# Patient Record
Sex: Male | Born: 1985 | Race: White | Hispanic: No | Smoking: Current every day smoker
Health system: Southern US, Community
[De-identification: ages and names within clinical notes are randomized; demographics above are authoritative.]

## PROBLEM LIST (undated history)

## (undated) DIAGNOSIS — A6002 Herpesviral infection of other male genital organs: Secondary | ICD-10-CM

## (undated) DIAGNOSIS — Z8619 Personal history of other infectious and parasitic diseases: Secondary | ICD-10-CM

## (undated) DIAGNOSIS — Z789 Other specified health status: Secondary | ICD-10-CM

## (undated) DIAGNOSIS — F1910 Other psychoactive substance abuse, uncomplicated: Secondary | ICD-10-CM

## (undated) DIAGNOSIS — F17200 Nicotine dependence, unspecified, uncomplicated: Secondary | ICD-10-CM

## (undated) HISTORY — PX: NO PAST SURGERIES: SHX2092

## (undated) HISTORY — DX: Personal history of other infectious and parasitic diseases: Z86.19

---

## 2007-12-02 ENCOUNTER — Emergency Department (HOSPITAL_COMMUNITY): Admission: EM | Admit: 2007-12-02 | Discharge: 2007-12-02 | Payer: Self-pay | Admitting: Emergency Medicine

## 2007-12-22 ENCOUNTER — Emergency Department (HOSPITAL_COMMUNITY): Admission: EM | Admit: 2007-12-22 | Discharge: 2007-12-22 | Payer: Self-pay | Admitting: Emergency Medicine

## 2008-08-11 ENCOUNTER — Emergency Department (HOSPITAL_COMMUNITY): Admission: EM | Admit: 2008-08-11 | Discharge: 2008-08-11 | Payer: Self-pay | Admitting: Emergency Medicine

## 2009-01-28 ENCOUNTER — Emergency Department (HOSPITAL_COMMUNITY): Admission: EM | Admit: 2009-01-28 | Discharge: 2009-01-28 | Payer: Self-pay | Admitting: Emergency Medicine

## 2010-04-30 ENCOUNTER — Emergency Department (HOSPITAL_COMMUNITY)
Admission: EM | Admit: 2010-04-30 | Discharge: 2010-04-30 | Disposition: A | Discharge status: Home / Self Care | Payer: Self-pay | Attending: Emergency Medicine | Admitting: Emergency Medicine

## 2010-04-30 ENCOUNTER — Emergency Department (HOSPITAL_COMMUNITY): Payer: Self-pay

## 2010-04-30 ENCOUNTER — Encounter (HOSPITAL_COMMUNITY): Payer: Self-pay | Admitting: Radiology

## 2010-04-30 DIAGNOSIS — R109 Unspecified abdominal pain: Secondary | ICD-10-CM | POA: Insufficient documentation

## 2010-04-30 DIAGNOSIS — N390 Urinary tract infection, site not specified: Secondary | ICD-10-CM | POA: Insufficient documentation

## 2010-04-30 DIAGNOSIS — R300 Dysuria: Secondary | ICD-10-CM | POA: Insufficient documentation

## 2010-04-30 HISTORY — DX: Herpesviral infection of other male genital organs: A60.02

## 2010-04-30 HISTORY — DX: Nicotine dependence, unspecified, uncomplicated: F17.200

## 2010-04-30 LAB — BASIC METABOLIC PANEL
Calcium: 8.9 mg/dL (ref 8.4–10.5)
Creatinine, Ser: 0.78 mg/dL (ref 0.4–1.5)
GFR calc Af Amer: >60 mL/min (ref >60)
GFR calc non Af Amer: >60 mL/min (ref >60)
Glucose, Bld: 96 mg/dL (ref 70–99)
Potassium: 3.3 mEq/L — ABNORMAL LOW (ref 3.5–5.1)
Sodium: 139 mEq/L (ref 135–145)

## 2010-04-30 LAB — CBC
HCT: 45.4 % (ref 39.0–52.0)
MCHC: 35.7 g/dL (ref 30.0–36.0)
Platelets: 237 10*3/uL (ref 150–400)
RBC: 4.8 MIL/uL (ref 4.22–5.81)
RDW: 12.2 % (ref 11.5–15.5)
WBC: 16.1 10*3/uL — ABNORMAL HIGH (ref 4.0–10.5)

## 2010-04-30 LAB — URINE MICROSCOPIC-ADD ON

## 2010-04-30 LAB — DIFFERENTIAL
Basophils Absolute: 0 10*3/uL (ref 0.0–0.1)
Basophils Relative: 0 % (ref 0–1)
Eosinophils Relative: 1 % (ref 0–5)
Lymphocytes Relative: 14 % (ref 12–46)
Monocytes Absolute: 1.3 10*3/uL — ABNORMAL HIGH (ref 0.1–1.0)

## 2010-04-30 LAB — URINALYSIS, ROUTINE W REFLEX MICROSCOPIC
Bilirubin Urine: NEGATIVE
Glucose, UA: NEGATIVE mg/dL
Ketones, ur: NEGATIVE mg/dL
Nitrite: POSITIVE — AB
Specific Gravity, Urine: 1.012 (ref 1.005–1.030)
pH: 6 (ref 5.0–8.0)

## 2010-05-02 LAB — URINE CULTURE
Colony Count: >=100000
Culture  Setup Time: 201204250939

## 2010-05-30 ENCOUNTER — Emergency Department (HOSPITAL_COMMUNITY): Payer: Medicaid Other

## 2010-05-30 ENCOUNTER — Inpatient Hospital Stay (HOSPITAL_COMMUNITY)
Admission: EM | Admit: 2010-05-30 | Discharge: 2010-06-01 | DRG: 690 | Disposition: A | Discharge status: Home / Self Care | Payer: Medicaid Other | Attending: Emergency Medicine | Admitting: Emergency Medicine

## 2010-05-30 DIAGNOSIS — F1210 Cannabis abuse, uncomplicated: Secondary | ICD-10-CM | POA: Diagnosis present

## 2010-05-30 DIAGNOSIS — F17200 Nicotine dependence, unspecified, uncomplicated: Secondary | ICD-10-CM | POA: Diagnosis present

## 2010-05-30 DIAGNOSIS — N3090 Cystitis, unspecified without hematuria: Principal | ICD-10-CM | POA: Diagnosis present

## 2010-05-30 DIAGNOSIS — E876 Hypokalemia: Secondary | ICD-10-CM | POA: Diagnosis present

## 2010-05-30 DIAGNOSIS — N119 Chronic tubulo-interstitial nephritis, unspecified: Secondary | ICD-10-CM

## 2010-05-30 DIAGNOSIS — D649 Anemia, unspecified: Secondary | ICD-10-CM | POA: Diagnosis present

## 2010-05-30 LAB — CBC
HCT: 43 % (ref 39.0–52.0)
Hemoglobin: 15.5 g/dL (ref 13.0–17.0)
MCHC: 36 g/dL (ref 30.0–36.0)
RDW: 12 % (ref 11.5–15.5)
WBC: 17.4 10*3/uL — ABNORMAL HIGH (ref 4.0–10.5)

## 2010-05-30 LAB — URINE MICROSCOPIC-ADD ON

## 2010-05-30 LAB — URINALYSIS, ROUTINE W REFLEX MICROSCOPIC
Glucose, UA: NEGATIVE mg/dL
Ketones, ur: 15 mg/dL — AB
Nitrite: POSITIVE — AB
Specific Gravity, Urine: 1.022 (ref 1.005–1.030)
pH: 6 (ref 5.0–8.0)

## 2010-05-30 LAB — COMPREHENSIVE METABOLIC PANEL
ALT: 20 U/L (ref 0–53)
AST: 24 U/L (ref 0–37)
Alkaline Phosphatase: 92 U/L (ref 39–117)
CO2: 25 mEq/L (ref 19–32)
Calcium: 9.8 mg/dL (ref 8.4–10.5)
GFR calc Af Amer: >60 mL/min (ref >60)
Potassium: 4 mEq/L (ref 3.5–5.1)
Sodium: 132 mEq/L — ABNORMAL LOW (ref 135–145)
Total Protein: 7.3 g/dL (ref 6.0–8.3)

## 2010-05-31 LAB — CBC
Hemoglobin: 12.5 g/dL — ABNORMAL LOW (ref 13.0–17.0)
MCH: 32.4 pg (ref 26.0–34.0)
MCHC: 34.5 g/dL (ref 30.0–36.0)
MCV: 93.8 fL (ref 78.0–100.0)
RBC: 3.86 MIL/uL — ABNORMAL LOW (ref 4.22–5.81)

## 2010-05-31 LAB — DRUGS OF ABUSE SCREEN W/O ALC, ROUTINE URINE
Benzodiazepines.: NEGATIVE
Cocaine Metabolites: NEGATIVE
Methadone: NEGATIVE
Opiate Screen, Urine: NEGATIVE
Phencyclidine (PCP): NEGATIVE

## 2010-05-31 LAB — BASIC METABOLIC PANEL
BUN: 10 mg/dL (ref 6–23)
CO2: 25 mEq/L (ref 19–32)
Chloride: 99 mEq/L (ref 96–112)
Creatinine, Ser: 0.99 mg/dL (ref 0.4–1.5)
Glucose, Bld: 115 mg/dL — ABNORMAL HIGH (ref 70–99)

## 2010-06-01 LAB — CBC
MCV: 92.7 fL (ref 78.0–100.0)
Platelets: 136 10*3/uL — ABNORMAL LOW (ref 150–400)
RDW: 12.3 % (ref 11.5–15.5)
WBC: 13.6 10*3/uL — ABNORMAL HIGH (ref 4.0–10.5)

## 2010-06-01 LAB — BASIC METABOLIC PANEL
BUN: 9 mg/dL (ref 6–23)
Creatinine, Ser: 0.87 mg/dL (ref 0.4–1.5)
GFR calc non Af Amer: >60 mL/min (ref >60)
Potassium: 3.9 mEq/L (ref 3.5–5.1)

## 2010-06-01 LAB — URINE CULTURE
Colony Count: >=100000
Culture  Setup Time: 201205251303

## 2010-06-04 LAB — THC (MARIJUANA), URINE, CONFIRMATION: Marijuana, Ur-Confirmation: 31 NG/ML — ABNORMAL HIGH

## 2010-06-08 NOTE — H&P (Signed)
Dean Mcdonald, Dean Mcdonald              ACCOUNT NO.:  [id number]  MEDICAL RECORD NO.:  [id number]           PATIENT TYPE:  O  LOCATION:  5505                         FACILITY:  [facility]  PHYSICIAN:  Lyndsy Gilberto A. Sheffield Slider, M.D.    DATE OF BIRTH:  [birth year]  DATE OF ADMISSION:  05/30/2010 DATE OF DISCHARGE:                             HISTORY & PHYSICAL   PRIMARY CARE PHYSICIAN:  Unassigned.  CHIEF COMPLAINT:  Fever, chills, and flank pain.  HISTORY OF PRESENT ILLNESS:  The patient is a 25 year old male with recent UTI in April 2012 treated with Cipro who comes in with complaint of subjective fever as well as chills and flank pain for the past 3 days.  The patient states he has not felt well for the past few days with chills and flank pain.  He has been taking Robitussin and NyQuil for subjective fever and has not actually measured his temperature at home.  Also, has had dysuria off and on for the past month.  Had similar symptoms x1 month ago and urine culture was done in the ED and grew out greater than 100,000, colonies of E. coli.  Given Cipro at that time, but he says he only took 2 days of this antibiotic.  When he began to have symptoms 3 days ago, he restarted the Cipro.  He denies penile discharge, suprapubic tenderness, hematuria, history of STI other than HSV.  PAST MEDICAL HISTORY:  HSV genital herpes.  PAST SURGICAL HISTORY:  None.  SOCIAL HISTORY:  He currently lives with his parents.  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  He does use three-fourth pack per day cigarettes.  He uses occasional alcohol.  He smokes marijuana.  FAMILY HISTORY:  Noncontributory.  REVIEW OF SYSTEMS:  Endorses fevers, chills, sweats, decreased appetite, cough, nausea, vomiting, and dysuria.  Denies headache, sore throat, chest pain, palpitations, dyspnea, wheezing, sputum production, hemoptysis, diarrhea, or hematuria.  ALLERGIES:  PENICILLIN causes hives.  MEDICATIONS:  Recently  restarted Cipro, other than that none.  PHYSICAL EXAM:  VITALS:  Temperature 99.4, pulse 105, respirations 20, blood pressure 123/77, satting 99% on room air. GENERAL:  In no acute distress. SKIN:  Warm to touch. HEENT:  Normocephalic and atraumatic.  Pupils are equal, round, and reactive to light.  Sclerae are anicteric.  Mucous membranes are moist. He does have hypertrophic uvula as well as tongue ring. NECK:  Supple without adenopathy. CARDIOVASCULAR:  Tachycardic with no murmurs, rubs, or gallops. LUNGS:  Clear to auscultation bilaterally. ABDOMEN:  Soft, nontender, nondistended with positive bowel sounds. BACK:  Positive for CVA tenderness on the right. EXTREMITIES:  Without edema. NEUROLOGIC:  Alert and oriented x3.  No focal deficits.  LABORATORIES AND STUDIES:  He had a CMET with sodium 132, potassium 4.0, chloride 96, bicarb 25, BUN of 11, creatinine 1.10, glucose 131.  Alk phos of 42, AST 24, ALT 20, albumin 3.6, calcium 9.8.  UA showed small bili, 15 ketones, moderate blood, 30 protein, positive nitrite, positive links.  Urine microscopy showed wbc's 11-20 per high-power field, rbc's of 3-6 per high-power field, and few bacteria.  CT of  abdomen and pelvis.  Impression: 1. Stable appearance of the appendix with large appendicolith in the     mid appendix.  No definite surrounding inflammatory change. 2. No renal or ureteral stones with no hydronephrosis.  ASSESSMENT AND PLAN:  A 25 year old male with recent urinary tract infection here with fevers, chills, and flank pain. 1. Urinary tract infection:  UA consistent with urinary tract     infection, although fever, chills, and flank pain also concerning     for pyelonephritis.  CT of the abdomen and pelvis done, negative     for stone, abscess, or hydronephrosis.  Culture from April 30, 2010, grew pansensitive Escherichia coli; however, the patient did     not complete course of antibiotics.  Urine culture sent  again,     however, may not grow anything as the patient recently restarted     Cipro.  For now, we will cover empirically with Rocephin.  He has     already had one dose in the emergency department.  We will use     Tylenol for pain and fever control for now.  Given history of     genital herpes simplex virus, we will also check for gonorrhea and     Chlamydia as well as human immunodeficiency virus, RPR given that     this patient is a young otherwise healthy male with urinary tract     infection being unusual. 2. Herpes simplex virus:  Last outbreak was greater than 1 year ago.     Currently, not on any antivirals at this time.  We will continue to     monitor. 3. Tobacco/marijuana dependence:  We will consult Social Work for     smoking cessation consult. 4. Fluids, electrolytes, nutrition/gastrointestinal:  Regular diet.     We will bolus 1 L x1 now, then normal saline at 125 mL per hour. 5. Prophylaxis:  Heparin subcutaneously. 6. Disposition:  Pending clinical improvement.    ______________________________ Everrett Coombe, MD   ______________________________ Arnette Norris. Sheffield Slider, M.D.    CM/MEDQ  D:  05/30/2010  T:  05/31/2010  Job:  [id number]  Electronically Signed by Everrett Coombe MD on 06/05/2010 11:07:29 PM Electronically Signed by Zachery Dauer M.D. on 06/08/2010 06:29:56 PM

## 2010-07-20 NOTE — Discharge Summary (Signed)
Dean Mcdonald, Dean Mcdonald              ACCOUNT NO.:  [id number]  MEDICAL RECORD NO.:  [id number]  LOCATION:  5505                         FACILITY:  [facility]  PHYSICIAN:  Dean Mcdonald, M.D.    DATE OF BIRTH:  [birth year]  DATE OF ADMISSION:  05/30/2010 DATE OF DISCHARGE:  06/01/2010                              DISCHARGE SUMMARY   DISCHARGE DIAGNOSIS:  Cystitis.  PROCEDURES:  CT of the abdomen and pelvis on May 30, 2010, is showing stable appearance of appendix with larger appendicolith in the mid appendix.  No definite surrounding inflammatory changes.  No renal or ureteral stones to hydronephrosis.  LABORATORY DATA AND STUDIES: 1. CBC on May 30, 2010 with a white count of 17.4, hemoglobin 15.5,     hematocrit 43.0, and platelet count 154. 2. CMP from May 30, 2010, is showing a sodium of 132, potassium 4.0,     chloride 96, CO2 of 25, BUN 11, creatinine 1.10, and chloride 131.     T bili 0.6, alk phos 92, AST 24, ALT 20.  Total protein 7.3,     albumin 3.6, and calcium 9.8. 3. HIV negative. 4. RPR negative. 5. UDS positive for marijuana. 6. GC and Chlamydia negative. 7. Urine culture is showing greater than 100,000 E-coli, pansensitive.  DISCHARGE MEDICATIONS: 1. Keflex 500 mg p.o. q.i.d. x11 days. 2. Ibuprofen 600 mg p.o. q. 6 h. p.r.n. pain. 3. NicoDerm patch 7 mg q.24 h. transdermal 1 tab daily for 20 days. 4. Tylenol 650 mg by mouth q.6 h. p.r.n. pain.  CONSULTATIONS:  None.  BRIEF HOSPITAL COURSE:  This is a 25 year old male who presented with recurrent cystitis.  1. Cystitis.  The patient initially presented to [facility] ED with     recurrence of cystitis type symptoms, fever, dysuria, suprapubic     tenderness as well as some questionable flank pain on presentation.     Of note, the patient was reportedly treated for this approximately     1 month prior with Cipro in the ED to which the patient reports     only taking for 2-3 days and stop taking upon resolution of  dysuria     type symptoms.  CT scan to the abdomen and pelvis was obtained     while in-house on presentation with no clinical indications for     pyelonephritis or an ascending urinary tract infection.  The     patient was placed on IV Rocephin while in-house for approximately     48 hours.  The patient is being transitioned to, by mouth Keflex,     for treatment upon obtaining urine culture sensitivities.  At the     time of discharge, the patient felt symptomatically much improved     with recommendations and reiterates of the importance to make sure     that the patient takes the Keflex on a scheduled basis for the next     11 days.  The patient will be treated for complicated cystitis     given recurrent symptoms as well as the patient having a baseline     male anatomy.  The patient is scheduled to follow-up at the [facility]  [facility] with the next 1-2 weeks for followup of     his symptoms. 2. Tobacco and marijuana use.  The patient reported to use tobacco on     presentation as well as being marijuana positive.  The patient was     counseled on nicotine for tobacco cessation while in-house and     being discharged with NicoDerm patch as the patient reports that he     will be compliant with this on discharge.  FOLLOWUP:  The patient is scheduled followup with myself at the [facility] within the next 1 or 2 weeks for followup.     Doree Albee, MD   ______________________________ Dean Mcdonald, M.D.    SN/MEDQ  D:  06/05/2010  T:  06/05/2010  Job:  [id number]  Electronically Signed by Doree Albee  on 07/12/2010 11:06:42 PM Electronically Signed by Zachery Dauer M.D. on 07/20/2010 09:24:07 PM

## 2010-10-24 ENCOUNTER — Emergency Department (HOSPITAL_COMMUNITY)
Admission: EM | Admit: 2010-10-24 | Discharge: 2010-10-24 | Disposition: A | Discharge status: Home / Self Care | Payer: Medicaid Other | Attending: Emergency Medicine | Admitting: Emergency Medicine

## 2010-10-24 DIAGNOSIS — A64 Unspecified sexually transmitted disease: Secondary | ICD-10-CM | POA: Insufficient documentation

## 2010-10-24 DIAGNOSIS — R300 Dysuria: Secondary | ICD-10-CM | POA: Insufficient documentation

## 2010-10-24 DIAGNOSIS — N390 Urinary tract infection, site not specified: Secondary | ICD-10-CM | POA: Insufficient documentation

## 2010-10-24 DIAGNOSIS — L293 Anogenital pruritus, unspecified: Secondary | ICD-10-CM | POA: Insufficient documentation

## 2010-10-24 LAB — URINALYSIS, ROUTINE W REFLEX MICROSCOPIC
Bilirubin Urine: NEGATIVE
Ketones, ur: NEGATIVE mg/dL
Nitrite: NEGATIVE
Specific Gravity, Urine: 1.014 (ref 1.005–1.030)
Urobilinogen, UA: 0.2 mg/dL (ref 0.0–1.0)

## 2011-03-01 ENCOUNTER — Emergency Department (HOSPITAL_COMMUNITY): Payer: Medicaid Other | Admitting: Anesthesiology

## 2011-03-01 ENCOUNTER — Encounter (HOSPITAL_COMMUNITY): Payer: Self-pay | Admitting: Anesthesiology

## 2011-03-01 ENCOUNTER — Encounter (HOSPITAL_COMMUNITY)
Admission: EM | Disposition: A | Discharge status: Home / Self Care | Payer: Self-pay | Source: Home / Self Care | Attending: Emergency Medicine

## 2011-03-01 ENCOUNTER — Ambulatory Visit (HOSPITAL_COMMUNITY)
Admission: EM | Admit: 2011-03-01 | Discharge: 2011-03-02 | Disposition: A | Discharge status: Home / Self Care | Payer: Medicaid Other | Attending: General Surgery | Admitting: General Surgery

## 2011-03-01 ENCOUNTER — Other Ambulatory Visit (INDEPENDENT_AMBULATORY_CARE_PROVIDER_SITE_OTHER): Payer: Self-pay | Admitting: General Surgery

## 2011-03-01 ENCOUNTER — Encounter (HOSPITAL_COMMUNITY): Payer: Self-pay | Admitting: Radiology

## 2011-03-01 ENCOUNTER — Encounter (HOSPITAL_COMMUNITY): Payer: Self-pay | Admitting: *Deleted

## 2011-03-01 ENCOUNTER — Emergency Department (HOSPITAL_COMMUNITY): Payer: Medicaid Other

## 2011-03-01 DIAGNOSIS — K3580 Unspecified acute appendicitis: Secondary | ICD-10-CM

## 2011-03-01 DIAGNOSIS — R1031 Right lower quadrant pain: Secondary | ICD-10-CM

## 2011-03-01 DIAGNOSIS — K37 Unspecified appendicitis: Secondary | ICD-10-CM | POA: Diagnosis present

## 2011-03-01 DIAGNOSIS — R112 Nausea with vomiting, unspecified: Secondary | ICD-10-CM

## 2011-03-01 DIAGNOSIS — F1410 Cocaine abuse, uncomplicated: Secondary | ICD-10-CM | POA: Insufficient documentation

## 2011-03-01 DIAGNOSIS — F17200 Nicotine dependence, unspecified, uncomplicated: Secondary | ICD-10-CM | POA: Insufficient documentation

## 2011-03-01 HISTORY — PX: APPENDECTOMY: SHX54

## 2011-03-01 HISTORY — DX: Other specified health status: Z78.9

## 2011-03-01 HISTORY — PX: LAPAROSCOPIC APPENDECTOMY: SHX408

## 2011-03-01 LAB — CBC
HCT: 45.6 % (ref 39.0–52.0)
Hemoglobin: 16.5 g/dL (ref 13.0–17.0)
MCH: 34.4 pg — ABNORMAL HIGH (ref 26.0–34.0)
MCHC: 36.2 g/dL — ABNORMAL HIGH (ref 30.0–36.0)
MCV: 95 fL (ref 78.0–100.0)
RBC: 4.8 MIL/uL (ref 4.22–5.81)

## 2011-03-01 LAB — COMPREHENSIVE METABOLIC PANEL
ALT: 24 U/L (ref 0–53)
Alkaline Phosphatase: 76 U/L (ref 39–117)
BUN: 7 mg/dL (ref 6–23)
CO2: 22 mEq/L (ref 19–32)
Calcium: 9.4 mg/dL (ref 8.4–10.5)
GFR calc Af Amer: >90 mL/min (ref >90)
GFR calc non Af Amer: >90 mL/min (ref >90)
Glucose, Bld: 107 mg/dL — ABNORMAL HIGH (ref 70–99)
Sodium: 134 mEq/L — ABNORMAL LOW (ref 135–145)
Total Protein: 6.9 g/dL (ref 6.0–8.3)

## 2011-03-01 LAB — DIFFERENTIAL
Basophils Relative: 1 % (ref 0–1)
Eosinophils Absolute: 0 10*3/uL (ref 0.0–0.7)
Eosinophils Relative: 0 % (ref 0–5)
Monocytes Absolute: 0.9 10*3/uL (ref 0.1–1.0)
Neutro Abs: 9.2 10*3/uL — ABNORMAL HIGH (ref 1.7–7.7)
Neutrophils Relative %: 73 % (ref 43–77)

## 2011-03-01 LAB — URINALYSIS, ROUTINE W REFLEX MICROSCOPIC
Hgb urine dipstick: NEGATIVE
Nitrite: NEGATIVE
Protein, ur: NEGATIVE mg/dL
Urobilinogen, UA: 1 mg/dL (ref 0.0–1.0)

## 2011-03-01 LAB — LIPASE, BLOOD: Lipase: 15 U/L (ref 11–59)

## 2011-03-01 SURGERY — APPENDECTOMY, LAPAROSCOPIC
Anesthesia: General | Site: Abdomen | Wound class: Dirty or Infected

## 2011-03-01 MED ORDER — DEXTROSE IN LACTATED RINGERS 5 % IV SOLN
INTRAVENOUS | Status: DC
  Administered 2011-03-01: 22:00:00 via INTRAVENOUS
  Administered 2011-03-02: 100 mL/h via INTRAVENOUS

## 2011-03-01 MED ORDER — HYDROMORPHONE HCL PF 1 MG/ML IJ SOLN
1.0000 mg | Freq: Once | INTRAMUSCULAR | Status: AC
  Administered 2011-03-01: 1 mg via INTRAVENOUS
  Filled 2011-03-01: qty 1

## 2011-03-01 MED ORDER — MORPHINE SULFATE 2 MG/ML IJ SOLN: 0.0500 mg/kg | INTRAMUSCULAR | Status: DC | PRN

## 2011-03-01 MED ORDER — MOXIFLOXACIN HCL IN NACL 400 MG/250ML IV SOLN
400.0000 mg | INTRAVENOUS | Status: DC
  Administered 2011-03-01: 400 mg via INTRAVENOUS
  Filled 2011-03-01 (×2): qty 250

## 2011-03-01 MED ORDER — SUCCINYLCHOLINE CHLORIDE 20 MG/ML IJ SOLN
INTRAMUSCULAR | Status: DC | PRN
  Administered 2011-03-01: 100 mg via INTRAVENOUS

## 2011-03-01 MED ORDER — GLYCOPYRROLATE 0.2 MG/ML IJ SOLN
INTRAMUSCULAR | Status: DC | PRN
  Administered 2011-03-01: .8 mg via INTRAVENOUS

## 2011-03-01 MED ORDER — ROCURONIUM BROMIDE 100 MG/10ML IV SOLN
INTRAVENOUS | Status: DC | PRN
  Administered 2011-03-01: 40 mg via INTRAVENOUS

## 2011-03-01 MED ORDER — METRONIDAZOLE IN NACL 5-0.79 MG/ML-% IV SOLN
500.0000 mg | Freq: Once | INTRAVENOUS | Status: AC
  Administered 2011-03-01: 500 mg via INTRAVENOUS
  Filled 2011-03-01: qty 100

## 2011-03-01 MED ORDER — ONDANSETRON HCL 4 MG/2ML IJ SOLN: 4.0000 mg | Freq: Once | INTRAMUSCULAR | Status: DC | PRN

## 2011-03-01 MED ORDER — DROPERIDOL 2.5 MG/ML IJ SOLN
INTRAMUSCULAR | Status: DC | PRN
  Administered 2011-03-01: 0.625 mg via INTRAVENOUS

## 2011-03-01 MED ORDER — SODIUM CHLORIDE 0.9 % IV SOLN
INTRAVENOUS | Status: DC | PRN
  Administered 2011-03-01 (×2): via INTRAVENOUS

## 2011-03-01 MED ORDER — ONDANSETRON HCL 4 MG/2ML IJ SOLN
INTRAMUSCULAR | Status: DC | PRN
  Administered 2011-03-01: 4 mg via INTRAVENOUS

## 2011-03-01 MED ORDER — NEOSTIGMINE METHYLSULFATE 1 MG/ML IJ SOLN
INTRAMUSCULAR | Status: DC | PRN
  Administered 2011-03-01: 5 mg via INTRAVENOUS

## 2011-03-01 MED ORDER — DEXTROSE 5 % IV SOLN
1.0000 g | Freq: Once | INTRAVENOUS | Status: AC
  Administered 2011-03-01: 1 g via INTRAVENOUS
  Filled 2011-03-01: qty 10

## 2011-03-01 MED ORDER — IOHEXOL 300 MG/ML  SOLN
20.0000 mL | INTRAMUSCULAR | Status: DC
  Administered 2011-03-01: 20 mL via ORAL

## 2011-03-01 MED ORDER — BUPIVACAINE-EPINEPHRINE 0.25% -1:200000 IJ SOLN
INTRAMUSCULAR | Status: DC | PRN
  Administered 2011-03-01: 20 mL

## 2011-03-01 MED ORDER — SODIUM CHLORIDE 0.9 % IR SOLN
Status: DC | PRN
  Administered 2011-03-01: 1000 mL

## 2011-03-01 MED ORDER — ONDANSETRON HCL 4 MG/2ML IJ SOLN
4.0000 mg | Freq: Once | INTRAMUSCULAR | Status: AC
  Administered 2011-03-01: 4 mg via INTRAVENOUS
  Filled 2011-03-01: qty 2

## 2011-03-01 MED ORDER — HYDROMORPHONE HCL PF 1 MG/ML IJ SOLN
0.2500 mg | INTRAMUSCULAR | Status: DC | PRN
  Administered 2011-03-01 (×2): 0.5 mg via INTRAVENOUS

## 2011-03-01 MED ORDER — HEPARIN SODIUM (PORCINE) 5000 UNIT/ML IJ SOLN
5000.0000 [IU] | Freq: Three times a day (TID) | INTRAMUSCULAR | Status: DC
  Administered 2011-03-02: 5000 [IU] via SUBCUTANEOUS
  Filled 2011-03-01 (×4): qty 1

## 2011-03-01 MED ORDER — FENTANYL CITRATE 0.05 MG/ML IJ SOLN
INTRAMUSCULAR | Status: DC | PRN
  Administered 2011-03-01: 100 ug via INTRAVENOUS

## 2011-03-01 MED ORDER — PROPOFOL 10 MG/ML IV BOLUS
INTRAVENOUS | Status: DC | PRN
  Administered 2011-03-01: 200 mg via INTRAVENOUS

## 2011-03-01 MED ORDER — OXYCODONE-ACETAMINOPHEN 5-325 MG PO TABS
1.0000 | ORAL_TABLET | ORAL | Status: DC | PRN
  Administered 2011-03-02 (×3): 2 via ORAL
  Filled 2011-03-01 (×3): qty 2

## 2011-03-01 MED ORDER — MIDAZOLAM HCL 5 MG/5ML IJ SOLN
INTRAMUSCULAR | Status: DC | PRN
  Administered 2011-03-01: 2 mg via INTRAVENOUS

## 2011-03-01 MED ORDER — IOHEXOL 300 MG/ML  SOLN
80.0000 mL | Freq: Once | INTRAMUSCULAR | Status: AC | PRN
  Administered 2011-03-01: 80 mL via INTRAVENOUS

## 2011-03-01 MED ORDER — MORPHINE SULFATE 4 MG/ML IJ SOLN
4.0000 mg | INTRAMUSCULAR | Status: DC | PRN
  Administered 2011-03-02 (×2): 4 mg via INTRAVENOUS
  Filled 2011-03-01 (×3): qty 1

## 2011-03-01 SURGICAL SUPPLY — 49 items
ADH SKN CLS APL DERMABOND .7 (GAUZE/BANDAGES/DRESSINGS) ×1
APPLIER CLIP ROT 10 11.4 M/L (STAPLE)
APR CLP MED LRG 11.4X10 (STAPLE)
BAG SPEC RTRVL LRG 6X4 10 (ENDOMECHANICALS) ×1
BLADE SURG ROTATE 9660 (MISCELLANEOUS) ×1 IMPLANT
CANISTER SUCTION 2500CC (MISCELLANEOUS) ×2 IMPLANT
CHLORAPREP W/TINT 26ML (MISCELLANEOUS) ×2 IMPLANT
CLIP APPLIE ROT 10 11.4 M/L (STAPLE) IMPLANT
CLOTH BEACON ORANGE TIMEOUT ST (SAFETY) ×2 IMPLANT
COVER SURGICAL LIGHT HANDLE (MISCELLANEOUS) ×2 IMPLANT
CUTTER LINEAR ENDO 35 ETS (STAPLE) IMPLANT
CUTTER LINEAR ENDO 35 ETS TH (STAPLE) ×1 IMPLANT
DECANTER SPIKE VIAL GLASS SM (MISCELLANEOUS) ×2 IMPLANT
DERMABOND ADVANCED (GAUZE/BANDAGES/DRESSINGS) ×1
DERMABOND ADVANCED .7 DNX12 (GAUZE/BANDAGES/DRESSINGS) ×1 IMPLANT
DRAPE UTILITY 15X26 W/TAPE STR (DRAPE) ×2 IMPLANT
ELECT REM PT RETURN 9FT ADLT (ELECTROSURGICAL) ×2
ELECTRODE REM PT RTRN 9FT ADLT (ELECTROSURGICAL) ×1 IMPLANT
ENDOLOOP SUT PDS II  0 18 (SUTURE)
ENDOLOOP SUT PDS II 0 18 (SUTURE) IMPLANT
GLOVE BIO SURGEON STRL SZ8 (GLOVE) ×2 IMPLANT
GLOVE BIOGEL PI IND STRL 8 (GLOVE) ×1 IMPLANT
GLOVE BIOGEL PI INDICATOR 8 (GLOVE) ×1
GOWN PREVENTION PLUS XLARGE (GOWN DISPOSABLE) ×2 IMPLANT
GOWN STRL NON-REIN LRG LVL3 (GOWN DISPOSABLE) ×3 IMPLANT
IV NS 1000ML (IV SOLUTION) ×6
IV NS 1000ML BAXH (IV SOLUTION) IMPLANT
KIT BASIN OR (CUSTOM PROCEDURE TRAY) ×2 IMPLANT
KIT ROOM TURNOVER OR (KITS) ×2 IMPLANT
NS IRRIG 1000ML POUR BTL (IV SOLUTION) ×2 IMPLANT
PAD ARMBOARD 7.5X6 YLW CONV (MISCELLANEOUS) ×4 IMPLANT
POUCH SPECIMEN RETRIEVAL 10MM (ENDOMECHANICALS) ×2 IMPLANT
RELOAD /EVU35 (ENDOMECHANICALS) IMPLANT
RELOAD CUTTER ETS 35MM STAND (ENDOMECHANICALS) IMPLANT
SCALPEL HARMONIC ACE (MISCELLANEOUS) ×2 IMPLANT
SET IRRIG TUBING LAPAROSCOPIC (IRRIGATION / IRRIGATOR) ×2 IMPLANT
SPECIMEN JAR SMALL (MISCELLANEOUS) ×2 IMPLANT
SUT VIC AB 4-0 PS2 27 (SUTURE) ×2 IMPLANT
SUT VICRYL 0 UR6 27IN ABS (SUTURE) ×1 IMPLANT
SWAB COLLECTION DEVICE MRSA (MISCELLANEOUS) IMPLANT
TOWEL OR 17X24 6PK STRL BLUE (TOWEL DISPOSABLE) ×2 IMPLANT
TOWEL OR 17X26 10 PK STRL BLUE (TOWEL DISPOSABLE) ×2 IMPLANT
TRAY FOLEY CATH 14FR (SET/KITS/TRAYS/PACK) ×2 IMPLANT
TRAY LAPAROSCOPIC (CUSTOM PROCEDURE TRAY) ×2 IMPLANT
TROCAR HASSON GELL 12X100 (TROCAR) ×2 IMPLANT
TROCAR Z-THREAD FIOS 12X100MM (TROCAR) ×2 IMPLANT
TROCAR Z-THREAD FIOS 5X100MM (TROCAR) ×2 IMPLANT
TUBE ANAEROBIC SPECIMEN COL (MISCELLANEOUS) IMPLANT
WATER STERILE IRR 1000ML POUR (IV SOLUTION) ×1 IMPLANT

## 2011-03-01 NOTE — Anesthesia Procedure Notes (Signed)
Procedure Name: Intubation Date/Time: 03/01/2011 5:14 PM Performed by: Molli Hazard Pre-anesthesia Checklist: Patient identified, Emergency Drugs available, Suction available and Patient being monitored Patient Re-evaluated:Patient Re-evaluated prior to inductionOxygen Delivery Method: Circle system utilized Preoxygenation: Pre-oxygenation with 100% oxygen Intubation Type: IV induction, Rapid sequence and Cricoid Pressure applied Laryngoscope Size: Miller and 2 Grade View: Grade I Tube type: Oral Tube size: 8.0 mm Number of attempts: 1 Secured at: 23 cm Tube secured with: Tape Dental Injury: Teeth and Oropharynx as per pre-operative assessment

## 2011-03-01 NOTE — ED Notes (Signed)
ABD pain ,RUQ pain for several days

## 2011-03-01 NOTE — ED Provider Notes (Signed)
History     CSN: [id number]  Arrival date & time 03/01/11  1151   First MD Initiated Contact with Patient 03/01/11 1213      Chief Complaint  Patient presents with  . Abdominal Pain    (Consider location/radiation/quality/duration/timing/severity/associated sxs/prior treatment) HPI Comments: Mr. Nuon presents with 4 day history of worsening abdominal pain. The pain is described as a constant cramping feeling which worsens to a "stabbing" pain which lasts for minutes. The pain started in his mid-abdomen above his umbilicus and periumbilical area that subsequently migrated to right lower quadrant yesterday. He tried taking pepto bismol, but this did not relieve his pain. Walking exacerbates his pain. His pain is associated with anorexia, nausea, and fever. The fever started 3 days ago. He has not taken anything to relieve his fever.   Mr. Eddinger has no known health problems. He denies history of abdominal surgery. He denies dysuria. Denies change to bowel movements.   Patient was hospitalized approximately one year ago for pyelonephritis. At that time he had a CT scan which showed an appendicolith. He was asked to followup for appendectomy but this was never done.  Patient is a 26 y.o. male presenting with abdominal pain. The history is provided by the patient.  Abdominal Pain The primary symptoms of the illness include abdominal pain, fever (for 3 days), nausea (for 4 days) and vomiting (one episode this AM). The primary symptoms of the illness do not include diarrhea, hematemesis or dysuria. The current episode started more than 2 days ago (4 days ago). The onset of the illness was gradual. The problem has been gradually worsening.  The patient has not had a change in bowel habit. Additional symptoms associated with the illness include anorexia. Symptoms associated with the illness do not include chills, constipation, frequency or back pain.    Past Medical History  Diagnosis Date  .  Herpes genitalis in men   . Cocaine abuse   . Active smoker     No past surgical history on file.  No family history on file.  History  Substance Use Topics  . Smoking status: Not on file  . Smokeless tobacco: Not on file  . Alcohol Use: Not on file      Review of Systems  Constitutional: Positive for fever (for 3 days) and appetite change. Negative for chills.  HENT: Negative for sore throat, rhinorrhea, neck pain and neck stiffness.   Eyes: Negative for redness.  Respiratory: Negative for cough.   Cardiovascular: Negative for chest pain.  Gastrointestinal: Positive for nausea (for 4 days), vomiting (one episode this AM), abdominal pain and anorexia. Negative for diarrhea, constipation and hematemesis.  Genitourinary: Negative for dysuria and frequency.  Musculoskeletal: Negative for myalgias and back pain.  Skin: Negative for rash.  Neurological: Negative for headaches.    Allergies  Penicillins  Home Medications  No current outpatient prescriptions on file.  BP 125/63  Pulse 104  Temp(Src) 101 F (38.3 C) (Oral)  Resp 20  SpO2 97%  Physical Exam  Nursing note and vitals reviewed. Constitutional: He is oriented to person, place, and time. He appears well-developed and well-nourished. No distress.  HENT:  Head: Normocephalic and atraumatic.  Eyes: Conjunctivae are normal. Right eye exhibits no discharge. Left eye exhibits no discharge.  Neck: Normal range of motion. Neck supple.  Cardiovascular: Normal rate, regular rhythm and normal heart sounds.   No murmur heard. Pulmonary/Chest: Effort normal and breath sounds normal. No respiratory distress. He has no wheezes.  He has no rales.  Abdominal: Soft. He exhibits no distension. There is generalized tenderness (Tender around umbilicus and right lower quadrant. Most severe at McBurney's point). There is guarding and tenderness at McBurney's point. There is no rebound, no CVA tenderness and negative Murphy's sign. No  hernia.       Positive Rovsing's sign.  Negative psoas sign.   Musculoskeletal: Normal range of motion.  Neurological: He is alert and oriented to person, place, and time.  Skin: Skin is warm. He is diaphoretic.  Psychiatric: He has a normal mood and affect.    ED Course  Procedures (including critical care time)  Labs Reviewed  CBC - Abnormal; Notable for the following:    WBC 12.6 (*)    MCH 34.4 (*)    MCHC 36.2 (*)    All other components within normal limits  DIFFERENTIAL - Abnormal; Notable for the following:    Neutro Abs 9.2 (*)    All other components within normal limits  COMPREHENSIVE METABOLIC PANEL - Abnormal; Notable for the following:    Sodium 134 (*)    Glucose, Bld 107 (*)    All other components within normal limits  URINALYSIS, ROUTINE W REFLEX MICROSCOPIC - Abnormal; Notable for the following:    Color, Urine AMBER (*) BIOCHEMICALS MAY BE AFFECTED BY COLOR   Bilirubin Urine SMALL (*)    Ketones, ur 15 (*)    All other components within normal limits  LIPASE, BLOOD   Ct Abdomen Pelvis W Contrast  03/01/2011  *RADIOLOGY REPORT*  Clinical Data: Abdominal pain  CT ABDOMEN AND PELVIS WITH CONTRAST  Technique:  Multidetector CT imaging of the abdomen and pelvis was performed following the standard protocol during bolus administration of intravenous contrast.  Contrast: 80mL OMNIPAQUE IOHEXOL 300 MG/ML IV SOLN  Comparison: 05/30/2010  Findings: Acute appendicitis.  A large appendicolith is present. Inflammatory changes of the appendix and adjacent soft tissues is present.  There is no free intraperitoneal gas.  There is no abscess formation.  Inflammatory adenopathy is associated.  Minimal atelectasis at the left lung base.  Liver, gallbladder, spleen, pancreas, adrenal glands, kidneys are within normal limits. No free fluid.  Bladder is unremarkable.  Diverticulosis of the sigmoid colon without diverticulitis.  IMPRESSION: Acute appendicitis. Critical Value/emergent  results were called by telephone at the time of interpretation on 03/01/2011  at 1500 hours to  Dr. Ignacia Palma, who verbally acknowledged these results.  Original Report Authenticated By: Donavan Burnet, M.D.     1. Acute appendicitis     12:29 PM Patient seen and examined. Work-up initiated. Medications ordered.   Vital signs reviewed and are as follows: Filed Vitals:   03/01/11 1206  BP: 125/63  Pulse: 104  Temp: 101 F (38.3 C)  Resp: 20   Concern for ruptured appendicitis. Antibiotics ordered.   12:29 PM Patient was discussed with Carleene Cooper III, MD  Patient has been seen by Dr. Ignacia Palma. Dr. Janee Morn of surgery was identified and will see the patient in emergency department.    MDM  Acute appendicitis confirmed on CT scan.  Medical screening examination/treatment/procedure(s) were conducted as a shared visit with non-physician practitioner(s) and myself.  I personally evaluated the patient during the encounter Pt is a 26 year old man who has had abdominal pain coming on for about 4 days.  He had CT of abdomen last year that showed an appendicolith, but did not follow this up.  Exam today shows RLQ tenderness without mass, rebound  or rigidity.  WBC is elevated at 12,600 and CT of abdomen/pelvis shows acute appendicitis.  Call To Violeta Gelinas, M.D., general surgeon, to see pt. Osvaldo Human, M.D.       Eustace Moore [place], [place] 03/01/11 1609  Carleene Cooper III, MD 03/02/11 [phone]

## 2011-03-01 NOTE — Anesthesia Preprocedure Evaluation (Signed)
Anesthesia Evaluation  Patient identified by MRN, date of birth, ID band Patient awake    Reviewed: Allergy & Precautions, H&P , NPO status , Patient's Chart, lab work & pertinent test results, reviewed documented beta blocker date and time   Airway Mallampati: II TM Distance: >3 FB Neck ROM: full    Dental   Pulmonary Current Smoker,          Cardiovascular neg cardio ROS     Neuro/Psych Negative Neurological ROS  Negative Psych ROS   GI/Hepatic negative GI ROS, (+)     substance abuse  cocaine use,   Endo/Other  Negative Endocrine ROS  Renal/GU negative Renal ROS  Genitourinary negative   Musculoskeletal   Abdominal   Peds  Hematology negative hematology ROS (+)   Anesthesia Other Findings See surgeon's H&P   Reproductive/Obstetrics negative OB ROS                           Anesthesia Physical Anesthesia Plan  ASA: III and Emergent  Anesthesia Plan: General   Post-op Pain Management:    Induction: Intravenous, Rapid sequence and Cricoid pressure planned  Airway Management Planned: Oral ETT  Additional Equipment:   Intra-op Plan:   Post-operative Plan: Extubation in OR  Informed Consent: I have reviewed the patients History and Physical, chart, labs and discussed the procedure including the risks, benefits and alternatives for the proposed anesthesia with the patient or authorized representative who has indicated his/her understanding and acceptance.     Plan Discussed with: CRNA and Surgeon  Anesthesia Plan Comments:         Anesthesia Quick Evaluation

## 2011-03-01 NOTE — H&P (Signed)
Dean Mcdonald is an 26 y.o. male.   Chief Complaint: Right lower quadrant abdominal pain HPI: Patient presented to the emergency department today with a four-day history of abdominal pain. It initially was in the periumbilical region. It gradually moved towards the right lower quadrant over last day. The pain has continued. He is associated nausea and vomiting. He has a decreased appetite. Of note, on previous CT scan for kidney stones he was noted to have an appendicolith in the past.  Past Medical History  Diagnosis Date  . Herpes genitalis in men   . Cocaine abuse   . Active smoker     No past surgical history on file.  No family history on file. Social History:  does not have a smoking history on file. He does not have any smokeless tobacco history on file. His alcohol and drug histories not on file.  Allergies:  Allergies  Allergen Reactions  . Penicillins Hives    Medications Prior to Admission  Medication Dose Route Frequency Provider Last Rate Last Dose  . cefTRIAXone (ROCEPHIN) 1 g in dextrose 5 % 50 mL IVPB  1 g Intravenous Once Carolee Rota, PA   1 g at 03/01/11 1301  . HYDROmorphone (DILAUDID) injection 1 mg  1 mg Intravenous Once Carolee Rota, PA   1 mg at 03/01/11 1259  . HYDROmorphone (DILAUDID) injection 1 mg  1 mg Intravenous Once Carolee Rota, PA   1 mg at 03/01/11 1524  . iohexol (OMNIPAQUE) 300 MG/ML solution 20 mL  20 mL Oral Q1 Hr x 2 Medication Radiologist, MD   20 mL at 03/01/11 1256  . iohexol (OMNIPAQUE) 300 MG/ML solution 80 mL  80 mL Intravenous Once PRN Medication Radiologist, MD   80 mL at 03/01/11 1446  . metroNIDAZOLE (FLAGYL) IVPB 500 mg  500 mg Intravenous Once Carolee Rota, PA   500 mg at 03/01/11 1446  . ondansetron (ZOFRAN) injection 4 mg  4 mg Intravenous Once Carolee Rota, PA   4 mg at 03/01/11 1258   No current outpatient prescriptions on file as of 03/01/2011.    Results for orders placed during the hospital encounter of  03/01/11 (from the past 48 hour(s))  CBC     Status: Abnormal   Collection Time   03/01/11 12:27 PM      Component Value Range Comment   WBC 12.6 (*) 4.0 - 10.5 (K/uL)    RBC 4.80  4.22 - 5.81 (MIL/uL)    Hemoglobin 16.5  13.0 - 17.0 (g/dL)    HCT 56.2  13.0 - 86.5 (%)    MCV 95.0  78.0 - 100.0 (fL)    MCH 34.4 (*) 26.0 - 34.0 (pg)    MCHC 36.2 (*) 30.0 - 36.0 (g/dL)    RDW 78.4  69.6 - 29.5 (%)    Platelets 153  150 - 400 (K/uL)   DIFFERENTIAL     Status: Abnormal   Collection Time   03/01/11 12:27 PM      Component Value Range Comment   Neutrophils Relative 73  43 - 77 (%)    Lymphocytes Relative 19  12 - 46 (%)    Monocytes Relative 7  3 - 12 (%)    Eosinophils Relative 0  0 - 5 (%)    Basophils Relative 1  0 - 1 (%)    Neutro Abs 9.2 (*) 1.7 - 7.7 (K/uL)    Lymphs Abs 2.4  0.7 - 4.0 (  K/uL)    Monocytes Absolute 0.9  0.1 - 1.0 (K/uL)    Eosinophils Absolute 0.0  0.0 - 0.7 (K/uL)    Basophils Absolute 0.1  0.0 - 0.1 (K/uL)    Smear Review MORPHOLOGY UNREMARKABLE     COMPREHENSIVE METABOLIC PANEL     Status: Abnormal   Collection Time   03/01/11 12:27 PM      Component Value Range Comment   Sodium 134 (*) 135 - 145 (mEq/L)    Potassium 3.6  3.5 - 5.1 (mEq/L)    Chloride 98  96 - 112 (mEq/L)    CO2 22  19 - 32 (mEq/L)    Glucose, Bld 107 (*) 70 - 99 (mg/dL)    BUN 7  6 - 23 (mg/dL)    Creatinine, Ser 4.09  0.50 - 1.35 (mg/dL)    Calcium 9.4  8.4 - 10.5 (mg/dL)    Total Protein 6.9  6.0 - 8.3 (g/dL)    Albumin 3.8  3.5 - 5.2 (g/dL)    AST 27  0 - 37 (U/L)    ALT 24  0 - 53 (U/L)    Alkaline Phosphatase 76  39 - 117 (U/L)    Total Bilirubin 0.6  0.3 - 1.2 (mg/dL)    GFR calc non Af Amer >90  >90 (mL/min)    GFR calc Af Amer >90  >90 (mL/min)   LIPASE, BLOOD     Status: Normal   Collection Time   03/01/11 12:27 PM      Component Value Range Comment   Lipase 15  11 - 59 (U/L)   URINALYSIS, ROUTINE W REFLEX MICROSCOPIC     Status: Abnormal   Collection Time   03/01/11   1:37 PM      Component Value Range Comment   Color, Urine AMBER (*) YELLOW  BIOCHEMICALS MAY BE AFFECTED BY COLOR   APPearance CLEAR  CLEAR     Specific Gravity, Urine 1.024  1.005 - 1.030     pH 7.0  5.0 - 8.0     Glucose, UA NEGATIVE  NEGATIVE (mg/dL)    Hgb urine dipstick NEGATIVE  NEGATIVE     Bilirubin Urine SMALL (*) NEGATIVE     Ketones, ur 15 (*) NEGATIVE (mg/dL)    Protein, ur NEGATIVE  NEGATIVE (mg/dL)    Urobilinogen, UA 1.0  0.0 - 1.0 (mg/dL)    Nitrite NEGATIVE  NEGATIVE     Leukocytes, UA NEGATIVE  NEGATIVE  MICROSCOPIC NOT DONE ON URINES WITH NEGATIVE PROTEIN, BLOOD, LEUKOCYTES, NITRITE, OR GLUCOSE <1000 mg/dL.   Ct Abdomen Pelvis W Contrast  03/01/2011  *RADIOLOGY REPORT*  Clinical Data: Abdominal pain  CT ABDOMEN AND PELVIS WITH CONTRAST  Technique:  Multidetector CT imaging of the abdomen and pelvis was performed following the standard protocol during bolus administration of intravenous contrast.  Contrast: 80mL OMNIPAQUE IOHEXOL 300 MG/ML IV SOLN  Comparison: 05/30/2010  Findings: Acute appendicitis.  A large appendicolith is present. Inflammatory changes of the appendix and adjacent soft tissues is present.  There is no free intraperitoneal gas.  There is no abscess formation.  Inflammatory adenopathy is associated.  Minimal atelectasis at the left lung base.  Liver, gallbladder, spleen, pancreas, adrenal glands, kidneys are within normal limits. No free fluid.  Bladder is unremarkable.  Diverticulosis of the sigmoid colon without diverticulitis.  IMPRESSION: Acute appendicitis. Critical Value/emergent results were called by telephone at the time of interpretation on 03/01/2011  at 1500 hours to  Dr. Ignacia Palma,  who verbally acknowledged these results.  Original Report Authenticated By: Donavan Burnet, M.D.    Review of Systems  Constitutional: Negative.   HENT: Negative.   Eyes: Negative.   Respiratory: Negative.   Cardiovascular: Negative.   Gastrointestinal: Positive for  nausea, vomiting and abdominal pain.  Genitourinary: Negative.   Musculoskeletal: Negative.   Skin: Negative.   Neurological: Negative.     Blood pressure 120/66, pulse 77, temperature 99.5 F (37.5 C), temperature source Oral, resp. rate 20, SpO2 95.00%. Physical Exam  Constitutional: He is oriented to person, place, and time. He appears well-developed and well-nourished.  HENT:  Head: Normocephalic and atraumatic.       Large gauge earrings bilateral ears  Eyes: EOM are normal. Pupils are equal, round, and reactive to light.  Neck: Normal range of motion. Neck supple.  Cardiovascular: Normal rate, regular rhythm, normal heart sounds and intact distal pulses.   Respiratory: Effort normal and breath sounds normal. He has no wheezes. He has no rales.  GI: Soft. He exhibits no distension. There is tenderness. There is guarding. There is no rebound.       Tenderness and voluntary guarding right lower quadrant  Musculoskeletal: Normal range of motion.  Neurological: He is alert and oriented to person, place, and time.  Skin: Skin is warm and dry.     Assessment/Plan Acute appendicitis: Patient received IV antibiotics in the emergency department. We will take him to the operating room for laparoscopic appendectomy. Procedure, risks, benefits, and alternatives were discussed with the patient and his family. Questions were answered. He agrees.  Brynja Marker E 03/01/2011, 4:22 PM

## 2011-03-01 NOTE — Op Note (Signed)
03/01/2011  6:17 PM  PATIENT:  Dean Mcdonald  26 y.o. male  PRE-OPERATIVE DIAGNOSIS:  acute appendicitis  POST-OPERATIVE DIAGNOSIS:  Acute appendicitis with focal perforation  PROCEDURE:  Procedure(s): APPENDECTOMY LAPAROSCOPIC  SURGEON:  Surgeon(s): Liz Malady, MD  PHYSICIAN ASSISTANT:   ASSISTANTS: none   ANESTHESIA:   general  EBL:  Total I/O In: 1000 [I.V.:1000] Out: -   BLOOD ADMINISTERED:none  DRAINS: none   SPECIMEN:  Excision  DISPOSITION OF SPECIMEN:  PATHOLOGY  COUNTS:  YES  DICTATION: Reubin Milan Dictation patient presented to the emergency department with four-day history of abdominal pain. He had a known history of appendicolith. CT scan demonstrated large appendicolith and acute appendicitis. He is brought to the operating room for appendectomy. Informed consent was obtained. The patient was identified in the preop holding area. He received intravenous antibiotics. He was brought to the operating room and general endotracheal anesthesia was administered by the anesthesia staff. His abdomen was prepped and draped in sterile fashion after nursing staff placed a Foley catheter. Time out procedure was done. Infraumbilical region was infiltrated with quarter percent Marcaine. Infraumbilical incision was made and subcutaneous tissues were dissected down revealing the anterior fascia. This was divided along the midline. Peritoneal cavity was entered under direct vision. A 0 Vicryl pursestring suture was placed around the fascial opening. The [facility] trocar was inserted into the abdomen. The abdomen was insufflated with carbon dioxide in standard fashion. Under direct vision a 12 mm left lower quadrant a 5 mm right middle abdomen port were placed. Local was used at the site. Laparoscopic exploration revealed a significantly inflamed appendix wrapped in omentum. Omentum was teased away. This covered a focal perforation. There was no gross contamination of pus. The  mesoappendix was divided with the harmonic scalpel achieving excellent hemostasis. The base of the appendix was not inflamed. The base was divided with the Endo GIA stapler. Vascular load was used. There is excellent staple line closure. Next the appendix was placed in an Endo Catch bag and removed from the abdomen via the left lower quadrant port site. The appendicolith was so large we had to extend the fascial opening in the left lower quadrant port site. After the appendix was removed there was some bleeding from the muscle layer. Likely from the inferior epigastric. This was suture ligated with 0 Vicryl suture. This port site was inspected from inside and hemostasis was ensured. The abdomen was copiously irrigated with saline. Staple line was intact. There was no further bleeding from the staple line the area or the left lower quadrant port site. Ports were removed and pneumoperitoneum was released. All 3 wounds were copiously irrigated. After local fascia was closed by tying the 0 Vicryl pursestring suture with care not to trap any intra-abdominal contents. All 3 wounds were closed with running 4-0 Vicryl subcuticular stitch followed by Dermabond. All counts were correct. Patient tolerated procedure well apparent complications to recovery in stable condition  PATIENT DISPOSITION:  PACU - hemodynamically stable.   Delay start of Pharmacological VTE agent (>24hrs) due to surgical blood loss or risk of bleeding:  no  Violeta Gelinas, MD, MPH, FACS Pager: [phone]  2/24/20136:17 PM

## 2011-03-01 NOTE — Anesthesia Postprocedure Evaluation (Signed)
Anesthesia Post Note  Patient: Dean Mcdonald  Procedure(s) Performed: Procedure(s) (LRB): APPENDECTOMY LAPAROSCOPIC (N/A)  Anesthesia type: general  Patient location: PACU  Post pain: Pain level controlled  Post assessment: Patient's Cardiovascular Status Stable  Last Vitals:  Filed Vitals:   03/01/11 1830  BP: 141/92  Pulse: 115  Temp:   Resp: 15    Post vital signs: Reviewed and stable  Level of consciousness: sedated  Complications: No apparent anesthesia complications

## 2011-03-01 NOTE — ED Notes (Signed)
Reporting abdominal pain x 3 days. Pain began in mid lower abdomen, today pain has localized to right lower abdomen. Reporting vomiting today. Reporting urine orange in coloration. Denying any back pain. Positive for rebound tenderness. Pt reporting hx of UTI.

## 2011-03-01 NOTE — Transfer of Care (Signed)
Immediate Anesthesia Transfer of Care Note  Patient: Dean Mcdonald  Procedure(s) Performed: Procedure(s) (LRB): APPENDECTOMY LAPAROSCOPIC (N/A)  Patient Location: PACU  Anesthesia Type: General  Level of Consciousness: awake and patient cooperative  Airway & Oxygen Therapy: Patient Spontanous Breathing and Patient connected to face mask oxygen  Post-op Assessment: Report given to PACU RN  Post vital signs: Reviewed and stable  Complications: No apparent anesthesia complications

## 2011-03-02 ENCOUNTER — Encounter (HOSPITAL_COMMUNITY): Payer: Self-pay | Admitting: General Surgery

## 2011-03-02 DIAGNOSIS — K37 Unspecified appendicitis: Secondary | ICD-10-CM | POA: Diagnosis present

## 2011-03-02 LAB — CBC
HCT: 42.9 % (ref 39.0–52.0)
MCV: 96.6 fL (ref 78.0–100.0)
RBC: 4.44 MIL/uL (ref 4.22–5.81)
WBC: 7.4 10*3/uL (ref 4.0–10.5)

## 2011-03-02 LAB — BASIC METABOLIC PANEL
BUN: 5 mg/dL — ABNORMAL LOW (ref 6–23)
CO2: 26 mEq/L (ref 19–32)
Chloride: 104 mEq/L (ref 96–112)
Creatinine, Ser: 0.9 mg/dL (ref 0.50–1.35)

## 2011-03-02 MED ORDER — OXYCODONE-ACETAMINOPHEN 5-325 MG PO TABS: 1.0000 | ORAL_TABLET | ORAL | Status: AC | PRN

## 2011-03-02 NOTE — Progress Notes (Signed)
1 Day Post-Op  Subjective: Pt ok. Mild pains. No N/V Tol clears Voiding well, walking well  Objective: Vital signs in last 24 hours: Temp:  [98.3 F (36.8 C)-101 F (38.3 C)] 98.5 F (36.9 C) (02/25 0945) Pulse Rate:  [53-115] 94  (02/25 0945) Resp:  [13-20] 18  (02/25 0945) BP: (93-141)/(50-92) 125/72 mmHg (02/25 0945) SpO2:  [92 %-100 %] 98 % (02/25 0945) Weight:  [68.72 kg (151 lb 8 oz)] 68.72 kg (151 lb 8 oz) (02/24 1945) Last BM Date: 02/28/11  Intake/Output this shift:    Physical Exam: BP 125/72  Pulse 94  Temp(Src) 98.5 F (36.9 C) (Oral)  Resp 18  Ht 6\' 1"  (1.854 m)  Wt 68.72 kg (151 lb 8 oz)  BMI 19.99 kg/m2  SpO2 98% Lungs: CTA without w/r/r Heart: Regular Abdomen: soft, ND, appropriately tender   Incisions all c/d/i without erythema or hematoma. Ext: No edema or tenderness   Labs: CBC  Basename 03/02/11 0500 03/01/11 1227  WBC 7.4 12.6*  HGB 14.9 16.5  HCT 42.9 45.6  PLT 146* 153   BMET  Basename 03/02/11 0500 03/01/11 1227  NA 137 134*  K 3.9 3.6  CL 104 98  CO2 26 22  GLUCOSE 105* 107*  BUN 5* 7  CREATININE 0.90 0.82  CALCIUM 9.0 9.4   LFT  Basename 03/01/11 1227  PROT 6.9  ALBUMIN 3.8  AST 27  ALT 24  ALKPHOS 76  BILITOT 0.6  BILIDIR --  IBILI --  LIPASE 15   PT/INR No results found for this basename: LABPROT:2,INR:2 in the last 72 hours ABG No results found for this basename: PHART:2,PCO2:2,PO2:2,HCO3:2 in the last 72 hours  Studies/Results: Ct Abdomen Pelvis W Contrast  03/01/2011  *RADIOLOGY REPORT*  Clinical Data: Abdominal pain  CT ABDOMEN AND PELVIS WITH CONTRAST  Technique:  Multidetector CT imaging of the abdomen and pelvis was performed following the standard protocol during bolus administration of intravenous contrast.  Contrast: 80mL OMNIPAQUE IOHEXOL 300 MG/ML IV SOLN  Comparison: 05/30/2010  Findings: Acute appendicitis.  A large appendicolith is present. Inflammatory changes of the appendix and adjacent soft  tissues is present.  There is no free intraperitoneal gas.  There is no abscess formation.  Inflammatory adenopathy is associated.  Minimal atelectasis at the left lung base.  Liver, gallbladder, spleen, pancreas, adrenal glands, kidneys are within normal limits. No free fluid.  Bladder is unremarkable.  Diverticulosis of the sigmoid colon without diverticulitis.  IMPRESSION: Acute appendicitis. Critical Value/emergent results were called by telephone at the time of interpretation on 03/01/2011  at 1500 hours to  Dr. Ignacia Palma, who verbally acknowledged these results.  Original Report Authenticated By: Donavan Burnet, M.D.    Assessment: Principal Problem:  *Appendicitis   Procedure(s): APPENDECTOMY LAPAROSCOPIC  Plan: Advance diet DC home  LOS: 1 day    Nichael Ehly F PA-C 03/02/2011 10:00 AM

## 2011-03-02 NOTE — Progress Notes (Signed)
UR of chart complete.  

## 2011-03-02 NOTE — Progress Notes (Signed)
Patient seen and examined.  Doing well.  Some shoulder pain relieved by Percocet.  Home today. Velora Heckler, MD, FACS General & Endocrine Surgery [facility]

## 2011-03-02 NOTE — Discharge Summary (Signed)
Physician Discharge Summary  Patient ID: Dean Mcdonald MRN: [id number] DOB/AGE: 07-Apr-1985 25 y.o.  Admit date: 03/01/2011 Discharge date: 03/02/2011  Admission Diagnoses: Appendicitis Discharge Diagnoses:  Principal Problem:  *Appendicitis    Procedures: Procedure(s): APPENDECTOMY LAPAROSCOPIC  Discharged Condition: good  Hospital Course: HPI: Patient presented to the emergency department today with a four-day history of abdominal pain. It initially was in the periumbilical region. It gradually moved towards the right lower quadrant over last day. The pain has continued. He is associated nausea and vomiting. He has a decreased appetite. Of note, on previous CT scan for kidney stones he was noted to have an appendicolith in the past.   After workup and evaluation in the ER, he was admitted by Dr. Janee Morn for appendicitis. He was taken to the OR and underwent lap appy without complications. He did very well post-op. Voiding well, walking well. Passing flatus and tolerating diet. He is stable for discharge home today.  Consults: None   Discharge Exam: Blood pressure 125/72, pulse 94, temperature 98.5 F (36.9 C), temperature source Oral, resp. rate 18, height 6\' 1"  (1.854 m), weight 68.72 kg (151 lb 8 oz), SpO2 98.00%. Lungs: CTA without w/r/r Heart: Regular Abdomen: soft, ND, appropriately tender   Incisions all c/d/i without erythema or hematoma. Ext: No edema or tenderness   Disposition: 01-Home or Self Care  Discharge Orders    Future Orders Please Complete By Expires   Diet general      Increase activity slowly      May shower / Bathe      Lifting restrictions      Comments:   Avoid heavy lifting more than 10lbs for 3 weeks.   No dressing needed      Call MD for:  redness, tenderness, or signs of infection (pain, swelling, redness, odor or green/yellow discharge around incision site)      Call MD for:  severe uncontrolled pain      Call MD for:  persistant  nausea and vomiting      Call MD for:  temperature >100.4        Medication List  As of 03/02/2011 10:08 AM   TAKE these medications         oxyCODONE-acetaminophen 5-325 MG per tablet   Commonly known as: PERCOCET   Take 1-2 tablets by mouth every 4 (four) hours as needed.           Follow-up Information    Follow up with CCS,MD, MD on 03/17/2011. (DOW clinic 1:30pm)    Contact information:   [facility] [address] [place] [address] [phone]          Signed: Alyse Mcdonald 03/02/2011, 10:08 AM

## 2011-03-02 NOTE — Discharge Summary (Signed)
Agree with above discharge note. Velora Heckler, MD, FACS General & Endocrine Surgery [facility]

## 2011-03-17 ENCOUNTER — Encounter (INDEPENDENT_AMBULATORY_CARE_PROVIDER_SITE_OTHER): Payer: Self-pay

## 2011-03-17 ENCOUNTER — Ambulatory Visit (INDEPENDENT_AMBULATORY_CARE_PROVIDER_SITE_OTHER): Payer: Medicaid Other | Admitting: General Surgery

## 2011-03-17 VITALS — BP 112/66 | HR 68 | Temp 99.4°F | Resp 18 | Ht 73.0 in | Wt 141.6 lb

## 2011-03-17 DIAGNOSIS — K3580 Unspecified acute appendicitis: Secondary | ICD-10-CM

## 2011-03-17 DIAGNOSIS — Z9889 Other specified postprocedural states: Secondary | ICD-10-CM

## 2011-03-17 NOTE — Patient Instructions (Signed)
Follow up as needed

## 2011-03-17 NOTE — Progress Notes (Signed)
Dean Mcdonald [birth year] [id number] 03/17/2011   History of Present Illness: Dean Mcdonald is a  26 y.o. male who presents today status post lap appy by Dr. Janee Morn.  Pathology reveals acute suppurative appendicitis.  The patient is tolerating a regular diet, having normal bowel movements, has good pain control.  He  is back to most normal activities.   Physical Exam: Abd: soft, nontender, active bowel sounds, nondistended.  All incisions are well healed.  Impression: 1.  Acute appendicitis, s/p lap appy  Plan: He  is able to return to normal activities. He  may follow up on a prn basis.

## 2011-07-15 ENCOUNTER — Emergency Department (HOSPITAL_COMMUNITY)
Admission: EM | Admit: 2011-07-15 | Discharge: 2011-07-16 | Disposition: A | Discharge status: Home / Self Care | Payer: Medicaid Other | Attending: Emergency Medicine | Admitting: Emergency Medicine

## 2011-07-15 ENCOUNTER — Encounter (HOSPITAL_COMMUNITY): Payer: Self-pay | Admitting: *Deleted

## 2011-07-15 DIAGNOSIS — A549 Gonococcal infection, unspecified: Secondary | ICD-10-CM

## 2011-07-15 DIAGNOSIS — F17200 Nicotine dependence, unspecified, uncomplicated: Secondary | ICD-10-CM | POA: Insufficient documentation

## 2011-07-15 DIAGNOSIS — A5400 Gonococcal infection of lower genitourinary tract, unspecified: Secondary | ICD-10-CM | POA: Insufficient documentation

## 2011-07-15 DIAGNOSIS — F1420 Cocaine dependence, uncomplicated: Secondary | ICD-10-CM | POA: Insufficient documentation

## 2011-07-15 DIAGNOSIS — A64 Unspecified sexually transmitted disease: Secondary | ICD-10-CM | POA: Insufficient documentation

## 2011-07-15 DIAGNOSIS — Z9089 Acquired absence of other organs: Secondary | ICD-10-CM | POA: Insufficient documentation

## 2011-07-15 LAB — URINALYSIS, ROUTINE W REFLEX MICROSCOPIC
Glucose, UA: NEGATIVE mg/dL
Nitrite: NEGATIVE
Protein, ur: NEGATIVE mg/dL
pH: 6 (ref 5.0–8.0)

## 2011-07-15 LAB — RAPID STREP SCREEN (MED CTR MEBANE ONLY): Streptococcus, Group A Screen (Direct): NEGATIVE

## 2011-07-15 LAB — URINE MICROSCOPIC-ADD ON

## 2011-07-15 NOTE — ED Notes (Signed)
The pt has had a cold sorethroat and a penis discharge for 2-3 days

## 2011-07-16 LAB — HIV ANTIBODY (ROUTINE TESTING W REFLEX): HIV: NONREACTIVE

## 2011-07-16 MED ORDER — LIDOCAINE HCL (PF) 1 % IJ SOLN
INTRAMUSCULAR | Status: AC
  Administered 2011-07-16: 0.9 mL
  Filled 2011-07-16: qty 5

## 2011-07-16 MED ORDER — AZITHROMYCIN 250 MG PO TABS
1000.0000 mg | ORAL_TABLET | Freq: Once | ORAL | Status: AC
  Administered 2011-07-16: 1000 mg via ORAL
  Filled 2011-07-16: qty 4

## 2011-07-16 MED ORDER — DOXYCYCLINE HYCLATE 100 MG PO CAPS: 100.0000 mg | ORAL_CAPSULE | Freq: Two times a day (BID) | ORAL | Status: AC

## 2011-07-16 MED ORDER — LIDOCAINE HCL (PF) 1 % IJ SOLN
0.9000 mL | Freq: Once | INTRAMUSCULAR | Status: AC
  Administered 2011-07-16: 0.9 mL

## 2011-07-16 MED ORDER — METRONIDAZOLE 500 MG PO TABS
2000.0000 mg | ORAL_TABLET | Freq: Once | ORAL | Status: AC
  Administered 2011-07-16: 2000 mg via ORAL
  Filled 2011-07-16: qty 4

## 2011-07-16 MED ORDER — CEFTRIAXONE SODIUM 250 MG IJ SOLR
250.0000 mg | Freq: Once | INTRAMUSCULAR | Status: AC
  Administered 2011-07-16: 250 mg via INTRAMUSCULAR
  Filled 2011-07-16: qty 250

## 2011-07-16 NOTE — ED Provider Notes (Signed)
History     CSN: [id number]  Arrival date & time 07/15/11  2130   First MD Initiated Contact with Patient 07/16/11 0005      Chief Complaint  Patient presents with  . multiple symptoms     HPI  History provided by the patient. Patient is a 26 year old male with past history of genital herpes who presents with complaints of fever, sore throat and penile discharge. Patient reports symptoms began with sore throat 3 days ago for symptoms of dysuria and penile discharge. Patient also reports having subjective fevers at home with occasional chills and sweats. Patient has not taken any medications for his symptoms. Patient does report recently being sexually active with a new partner. Patient states he was at a party and heavily intoxicated when he met the partner. He admits to unprotected oral and vaginal intercourse. Patient has not taken any medications for symptoms. He denies any other aggravating or alleviating factors.    Past Medical History  Diagnosis Date  . Herpes genitalis in men   . Cocaine abuse   . Active smoker   . No pertinent past medical history     Past Surgical History  Procedure Date  . No past surgeries   . Laparoscopic appendectomy 03/01/2011    Procedure: APPENDECTOMY LAPAROSCOPIC;  Surgeon: Liz Malady, MD;  Location: [facility] OR;  Service: General;  Laterality: N/A;  . Appendectomy 03/01/11    No family history on file.  History  Substance Use Topics  . Smoking status: Current Everyday Smoker -- 0.5 packs/day for 18 years    Types: Cigarettes  . Smokeless tobacco: Never Used  . Alcohol Use: Yes     socially      Review of Systems  Constitutional: Positive for fever and chills. Negative for appetite change.  HENT: Positive for sore throat. Negative for congestion and rhinorrhea.   Respiratory: Negative for cough.   Gastrointestinal: Negative for nausea, vomiting and abdominal pain.  Genitourinary: Positive for dysuria and discharge. Negative for  frequency, hematuria, flank pain and penile pain.  Neurological: Negative for headaches.    Allergies  Penicillins  Home Medications  No current outpatient prescriptions on file.  BP 140/66  Pulse 69  Temp 100.2 F (37.9 C) (Oral)  Resp 16  SpO2 100%  Physical Exam  Nursing note and vitals reviewed. Constitutional: He is oriented to person, place, and time. He appears well-developed and well-nourished. No distress.  HENT:  Head: Normocephalic.  Eyes: Conjunctivae and EOM are normal. Pupils are equal, round, and reactive to light.  Neck: Normal range of motion. Neck supple.  Cardiovascular: Normal rate and regular rhythm.   Pulmonary/Chest: Effort normal and breath sounds normal. No respiratory distress. He has no wheezes. He has no rales.  Abdominal: Soft. There is no tenderness. There is no rebound and no guarding. Hernia confirmed negative in the right inguinal area and confirmed negative in the left inguinal area.  Genitourinary: Right testis shows no swelling and no tenderness. Left testis shows no swelling and no tenderness. Circumcised. Discharge found.  Lymphadenopathy:    He has cervical adenopathy.       Right: Inguinal adenopathy present.       Left: Inguinal adenopathy present.  Neurological: He is alert and oriented to person, place, and time.  Skin: Skin is warm.  Psychiatric: He has a normal mood and affect. His behavior is normal.    ED Course  Procedures   Results for orders placed during the hospital encounter  of 07/15/11  URINALYSIS, ROUTINE W REFLEX MICROSCOPIC      Component Value Range   Color, Urine YELLOW  YELLOW   APPearance CLOUDY (*) CLEAR   Specific Gravity, Urine 1.025  1.005 - 1.030   pH 6.0  5.0 - 8.0   Glucose, UA NEGATIVE  NEGATIVE mg/dL   Hgb urine dipstick NEGATIVE  NEGATIVE   Bilirubin Urine NEGATIVE  NEGATIVE   Ketones, ur NEGATIVE  NEGATIVE mg/dL   Protein, ur NEGATIVE  NEGATIVE mg/dL   Urobilinogen, UA 1.0  0.0 - 1.0 mg/dL    Nitrite NEGATIVE  NEGATIVE   Leukocytes, UA LARGE (*) NEGATIVE  RAPID STREP SCREEN      Component Value Range   Streptococcus, Group A Screen (Direct) NEGATIVE  NEGATIVE  URINE MICROSCOPIC-ADD ON      Component Value Range   WBC, UA TOO NUMEROUS TO COUNT  <3 WBC/hpf   RBC / HPF 0-2  <3 RBC/hpf   Bacteria, UA FEW (*) RARE   Urine-Other SPERM PRESENT       1. Sexually transmitted disease   2. Gonorrhea       MDM  12:25AM patient seen and evaluated. Patient no acute distress.  Patient treated for gonorrhea infection. Prescription for doxycycline given for the next 7 days. Patient encouraged followup with [facility] health Department.      Angus Seller, [place] 07/16/11 [phone]

## 2011-07-16 NOTE — ED Provider Notes (Signed)
Medical screening examination/treatment/procedure(s) were performed by non-physician practitioner and as supervising physician I was immediately available for consultation/collaboration.   Charles B. Bernette Mayers, MD 07/16/11 [phone]

## 2011-07-17 LAB — STREP A DNA PROBE
Group A Strep Probe: NEGATIVE
Special Requests: NORMAL

## 2011-07-18 LAB — GC/CHLAMYDIA PROBE AMP, URINE: GC Probe Amp, Urine: POSITIVE — AB

## 2011-07-18 LAB — URINE CULTURE: Colony Count: >=100000

## 2011-07-19 NOTE — ED Notes (Addendum)
Patient treated with Doxycyline-sensitive to same-chart appended per protocol MD. + Gonorrhea Patient treated with Rocephin and Zithromax. [employer]HS letter faxed

## 2011-07-20 NOTE — ED Notes (Signed)
Patient informed of positive results after id'd x 2 and informed of need to notify partner to be treated. 

## 2013-11-24 ENCOUNTER — Emergency Department (HOSPITAL_COMMUNITY)
Admission: EM | Admit: 2013-11-24 | Discharge: 2013-11-24 | Disposition: A | Discharge status: Home / Self Care | Payer: Medicaid Other | Attending: Emergency Medicine | Admitting: Emergency Medicine

## 2013-11-24 ENCOUNTER — Encounter (HOSPITAL_COMMUNITY): Payer: Self-pay | Admitting: *Deleted

## 2013-11-24 ENCOUNTER — Emergency Department (HOSPITAL_COMMUNITY): Payer: Medicaid Other

## 2013-11-24 DIAGNOSIS — Z23 Encounter for immunization: Secondary | ICD-10-CM | POA: Insufficient documentation

## 2013-11-24 DIAGNOSIS — Y9389 Activity, other specified: Secondary | ICD-10-CM | POA: Insufficient documentation

## 2013-11-24 DIAGNOSIS — Z8619 Personal history of other infectious and parasitic diseases: Secondary | ICD-10-CM | POA: Insufficient documentation

## 2013-11-24 DIAGNOSIS — IMO0002 Reserved for concepts with insufficient information to code with codable children: Secondary | ICD-10-CM

## 2013-11-24 DIAGNOSIS — W25XXXA Contact with sharp glass, initial encounter: Secondary | ICD-10-CM | POA: Insufficient documentation

## 2013-11-24 DIAGNOSIS — Z720 Tobacco use: Secondary | ICD-10-CM | POA: Insufficient documentation

## 2013-11-24 DIAGNOSIS — S61511A Laceration without foreign body of right wrist, initial encounter: Secondary | ICD-10-CM | POA: Insufficient documentation

## 2013-11-24 DIAGNOSIS — Z880 Allergy status to penicillin: Secondary | ICD-10-CM | POA: Insufficient documentation

## 2013-11-24 DIAGNOSIS — Y998 Other external cause status: Secondary | ICD-10-CM | POA: Insufficient documentation

## 2013-11-24 DIAGNOSIS — Y9289 Other specified places as the place of occurrence of the external cause: Secondary | ICD-10-CM | POA: Insufficient documentation

## 2013-11-24 MED ORDER — LIDOCAINE HCL 2 % IJ SOLN
10.0000 mL | Freq: Once | INTRAMUSCULAR | Status: AC
  Administered 2013-11-24: 200 mg
  Filled 2013-11-24: qty 20

## 2013-11-24 MED ORDER — CLINDAMYCIN HCL 300 MG PO CAPS
450.0000 mg | ORAL_CAPSULE | Freq: Once | ORAL | Status: AC
  Administered 2013-11-24: 450 mg via ORAL
  Filled 2013-11-24: qty 1

## 2013-11-24 MED ORDER — OXYCODONE-ACETAMINOPHEN 5-325 MG PO TABS
1.0000 | ORAL_TABLET | Freq: Once | ORAL | Status: AC
  Administered 2013-11-24: 1 via ORAL
  Filled 2013-11-24: qty 1

## 2013-11-24 MED ORDER — CLINDAMYCIN HCL 150 MG PO CAPS: 450.0000 mg | ORAL_CAPSULE | Freq: Three times a day (TID) | ORAL | Status: DC

## 2013-11-24 MED ORDER — BUPIVACAINE-EPINEPHRINE (PF) 0.5% -1:200000 IJ SOLN
20.0000 mL | Freq: Once | INTRAMUSCULAR | Status: AC
  Administered 2013-11-24: 20 mL
  Filled 2013-11-24: qty 20

## 2013-11-24 MED ORDER — TETANUS-DIPHTH-ACELL PERTUSSIS 5-2.5-18.5 LF-MCG/0.5 IM SUSP
0.5000 mL | Freq: Once | INTRAMUSCULAR | Status: AC
  Administered 2013-11-24: 0.5 mL via INTRAMUSCULAR
  Filled 2013-11-24: qty 0.5

## 2013-11-24 NOTE — ED Notes (Signed)
Pt to xray
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
## 2013-11-24 NOTE — Discharge Instructions (Signed)
Wash the affected area with soap and water and apply a thin layer of topical antibiotic ointment. Do this every 12 hours.   Do not use rubbing alcohol or hydrogen peroxide.                        Look for signs of infection: if you see redness, if the area becomes warm, if pain increases sharply, there is discharge (pus), if red streaks appear or you develop fever or vomiting, RETURN immediately to the Emergency Department  for a recheck.   Take your antibiotics as directed and to completion. You should never have any leftover antibiotics! Push fluids and stay well hydrated.   Do not hesitate to return to the emergency room for any new, worsening or concerning symptoms.  Please obtain primary care using resource guide below. But the minute you were seen in the emergency room and that they will need to obtain records for further outpatient management.    Laceration Care, Adult A laceration is a cut that goes through all layers of the skin. The cut goes into the tissue beneath the skin. HOME CARE For stitches (sutures) or staples:  Keep the cut clean and dry.  If you have a bandage (dressing), change it at least once a day. Change the bandage if it gets wet or dirty, or as told by your doctor.  Wash the cut with soap and water 2 times a day. Rinse the cut with water. Pat it dry with a clean towel.  Put a thin layer of medicated cream on the cut as told by your doctor.  You may shower after the first 24 hours. Do not soak the cut in water until the stitches are removed.  Only take medicines as told by your doctor.  Have your stitches or staples removed as told by your doctor. For skin adhesive strips:  Keep the cut clean and dry.  Do not get the strips wet. You may take a bath, but be careful to keep the cut dry.  If the cut gets wet, pat it dry with a clean towel.  The strips will fall off on their own. Do not remove the strips that are still stuck to the cut. For wound  glue:  You may shower or take baths. Do not soak or scrub the cut. Do not swim. Avoid heavy sweating until the glue falls off on its own. After a shower or bath, pat the cut dry with a clean towel.  Do not put medicine on your cut until the glue falls off.  If you have a bandage, do not put tape over the glue.  Avoid lots of sunlight or tanning lamps until the glue falls off. Put sunscreen on the cut for the first year to reduce your scar.  The glue will fall off on its own. Do not pick at the glue. You may need a tetanus shot if:  You cannot remember when you had your last tetanus shot.  You have never had a tetanus shot. If you need a tetanus shot and you choose not to have one, you may get tetanus. Sickness from tetanus can be serious. GET HELP RIGHT AWAY IF:   Your pain does not get better with medicine.  Your arm, hand, leg, or foot loses feeling (numbness) or changes color.  Your cut is bleeding.  Your joint feels weak, or you cannot use your joint.  You have painful lumps on your body.  Your cut is red, puffy (swollen), or painful.  You have a red line on the skin near the cut.  You have yellowish-white fluid (pus) coming from the cut.  You have a fever.  You have a bad smell coming from the cut or bandage.  Your cut breaks open before or after stitches are removed.  You notice something coming out of the cut, such as wood or glass.  You cannot move a finger or toe. MAKE SURE YOU:   Understand these instructions.  Will watch your condition.  Will get help right away if you are not doing well or get worse. Document Released: 06/10/2007 Document Revised: 03/16/2011 Document Reviewed: 06/17/2010 [facility] Patient Information 2015 [employer] [place]. This information is not intended to replace advice given to you by your health care provider. Make sure you discuss any questions you have with your health care provider.   Emergency Department Resource Guide 1)  Find a Doctor and Pay Out of Pocket Although you won't have to find out who is covered by your insurance plan, it is a good idea to ask around and get recommendations. You will then need to call the office and see if the doctor you have chosen will accept you as a new patient and what types of options they offer for patients who are self-pay. Some doctors offer discounts or will set up payment plans for their patients who do not have insurance, but you will need to ask so you aren't surprised when you get to your appointment.  2) Contact Your Local Health Department Not all health departments have doctors that can see patients for sick visits, but many do, so it is worth a call to see if yours does. If you don't know where your local health department is, you can check in your phone book. The CDC also has a tool to help you locate your state's health department, and many state websites also have listings of all of their local health departments.  3) Find a Walk-in Clinic If your illness is not likely to be very severe or complicated, you may want to try a walk in clinic. These are popping up all over the country in pharmacies, drugstores, and shopping centers. They're usually staffed by nurse practitioners or physician assistants that have been trained to treat common illnesses and complaints. They're usually fairly quick and inexpensive. However, if you have serious medical issues or chronic medical problems, these are probably not your best option.  No Primary Care Doctor: - Call Health Connect at  [phone] - they can help you locate a primary care doctor that  accepts your insurance, provides certain services, etc. - Physician Referral Service- [phone]  Chronic Pain Problems: Organization         Address  Phone   Notes  [facility]  [phone] Patients need to be referred by their primary care doctor.   Medication Assistance: Organization         Address  Phone    Notes  [facility] Medication [facility] [address] [place], [place] [id number] [phone] --Must be a resident of [place] -- Must have NO insurance coverage whatsoever (no Medicaid/ Medicare, etc.) -- The pt. MUST have a primary care doctor that directs their care regularly and follows them in the community   MedAssist  [phone]   [employer]  [phone]    Agencies that provide inexpensive medical care: Organization  Address  Phone   Notes  [place]  [phone]   [facility]    [phone]   [facility] [place], [place] [address] [phone]   [place] [address], [place] [phone]   Planned Parenthood    [phone]   [facility]    [phone]   [place] and [place] [address], [place] Phone:  [phone], Fax:  [phone] Hours of Operation:  9 am - 6 pm, M-F.  Also accepts Medicaid/Medicare and self-pay.  [employer] for [place], [address], [place] Phone: [phone], Fax: [phone]. Hours of Operation:  8:30 am - 5:30 pm, M-F.  Also accepts Medicaid and self-pay.  [facility] [place] [address], [place] Phone: [phone]   [place], [place], [place] [phone], Ext. [phone] Mondays & Thursdays: 7-9 AM.  First 15 patients are seen on a first come, first serve basis.    [place] Providers:  Organization         Address  Phone   Notes  [facility] [address], [place] [phone] Also accepts self-pay patients.  [facility] [address], [place]  [phone]   [place], [address], [place] [phone]   [facility]  Medicine [address] [place] [phone]   Lucianne Lei [address], [place]   [phone] Only accepts [place] Access [place] patients after they have their name applied to their card.   Self-Pay (no insurance) in [facility]:  Organization         Address  Phone   Notes  Sickle Cell Patients, [facility] [place] [phone]   [facility] Urgent Care [place] [phone]   [facility] [place]  [place], [place], [place] [phone]   [facility]/Dr. Osei-Bonsu  [address] [place] or [address], [place] [phone] Phone number for both [place] and [place] locations is the same.  Urgent Medical and [facility] [address] [place] [phone]   [facility] [address] [place] or [address] [phone]   [facility] [address] [place] [phone], phone; [phone], fax Sees patients 1st and 3rd Saturday of every month.  Must not qualify for public or private insurance (i.e. Medicaid, Medicare, [place] Health Choice, Veterans' Benefits)  Household income should be no more than 200% of the poverty level The clinic cannot treat you if you are pregnant or think you are pregnant  Sexually transmitted diseases are not treated at the clinic.    Dental Care: Organization         Address  Phone  Notes  [facility] Department of [facility] [place] [phone] Accepts children up to age 55 who are enrolled in [place] or [place]; pregnant women with a Medicaid card; and children who have applied for Medicaid or [place] Health Choice, but were declined, whose parents can pay a reduced fee at time of  service.  [facility] Department of [facility]  [address], [place] [phone] Accepts children up to age 26 who are enrolled in [place] or [place]; pregnant women with a Medicaid card; and children who have applied for Medicaid or [place] Health Choice, but were declined, whose parents can pay a reduced fee at time of service.  [place] Adult Dental Access PROGRAM  [place] [phone] Patients are seen by appointment only. Walk-ins are not accepted. [place] will see patients 47 years of age and older. Monday - Tuesday (8am-5pm) Most Wednesdays (8:30-5pm) $30 per visit, cash only  [facility] Adult Dental Access PROGRAM  [address], [facility] [phone] Patients are seen by appointment only. Walk-ins are not accepted. [place] will see patients 77 years of age and older. One Wednesday Evening (Monthly: Volunteer Based).  $30 per visit, cash only  [place]  [phone] for adults; Children under age 28, call Graduate Pediatric Dentistry at [phone]. Children aged 59-14, please call [phone] to request a pediatric application.  Dental services are provided in all areas of dental care including fillings, crowns and bridges, complete and partial dentures, implants, gum treatment, root canals, and extractions. Preventive care is also provided. Treatment is provided to both adults and children. Patients are selected via a lottery and there is often a waiting list.   [facility] [address] [place]  [phone] [email or web address]   [facility] [address] [place], [place] [phone], Ext. [phone] Second and Fourth Thursday of each month, opens at 6:30 AM; Clinic ends at 9 AM.  Patients are seen on a first-come first-served basis, and a limited number are seen during each clinic.   [facility]  [address] Hillard Danker [place], [place] [phone]   Eligibility Requirements You must have lived in  [place], [place], or [place] counties for at least the last three months.   You cannot be eligible for state or federal sponsored [employer], including [employer], [place], or [employer].   You generally cannot be eligible for healthcare insurance through your employer.    How to apply: Eligibility screenings are held every Tuesday and Wednesday afternoon from 1:00 pm until 4:00 pm. You do not need an appointment for the interview!  [facility] [address], [place], [place]   [place]  [place] Department  [place]  [phone]    Behavioral Health Resources in the Community: Intensive Outpatient Programs Organization         Address  Phone  Notes  [place]. [address], [place], [place] [phone]   [facility] Outpatient [address] [place], [place]   ADS: Alcohol & Drug Svcs [address], [place], [place]   [place] [address],  [place], [place] or [phone]   Substance Abuse Resources Organization         Address  Phone  Notes  Alcohol and Drug Services  [phone]   [place]  [phone]   [place]  [phone]   [place]inita Pester  [phone]   Residential & Outpatient Substance Abuse Program  [phone]   Psychological Services Organization         Address  Phone  Notes  [facility]  [phone]   [facility] Services  [phone]   [facility] Health [address] [place] [phone] or [phone]    Mobile Crisis Teams Organization         Address  Phone  Notes  [facility], Mobile Crisis Care Unit  [phone]   [facility]  [address] [place], [place] [phone]   Doristine Locks [address] [place] [phone]    Self-Help/Support Groups Organization         Address  Phone             Notes  Mental Health Assoc. of [place] - variety of support groups  336- I7437963 Call for more information  Narcotics Anonymous (NA), Caring [facility] [address], [employer] [place]  2 meetings at this location   Statistician         Address  Phone  Notes  [facility] [address],    [place]  [phone]   [facility]  [address], [place] [address], [place], [place] [phone]   [facility] [address] [place], [place] [phone] Admissions: 8am-3pm M-F  [facility] [address]    [place], [place] [phone]   [facility] [address] [place], [employer], [place] [phone]   [facility] [address]  [place], [place] [phone]   [facility] - Intensive Outpatient [address], [place], [place] [phone]   [facility] (Addiction Recovery Care [facility] [address]  [place], [place] [phone] or [phone]   [facility] ([facility]) [address] [place], [place] [phone] Accepts Medicaid  [facility] [employer] [address]  [place] [phone] Substance Abuse/Addiction Treatment   [facility] Organization         Address  Phone  Notes  [facility]  [phone]   Angie Fava, PhD [address] Ervin Knack [place], [place]   2527687613 or 8170760298   [facility]al   [address] [place], [place] [phone]   [facility] [address] [place], [place] [phone] Insurance/Medicaid/sponsorship through [facility] and Families [address]                                    [place], [place] [phone] Therapy/tele-psych/case  [facility] [address], [place] [phone]    Dr. Lolly Mustache  [phone]   [facility] [place] [facility]  [employer] [facility] Dept. 1) [address] [place] 2) [address], [place] 3)  [address], [place] [phone]  [phone]   [facility] Child Abuse Hotline (562)[phone] or [phone] (After Hours)
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
## 2013-12-09 ENCOUNTER — Emergency Department (HOSPITAL_COMMUNITY): Payer: Medicaid Other

## 2013-12-09 ENCOUNTER — Encounter (HOSPITAL_COMMUNITY): Payer: Self-pay | Admitting: *Deleted

## 2013-12-09 ENCOUNTER — Emergency Department (HOSPITAL_COMMUNITY)
Admission: EM | Admit: 2013-12-09 | Discharge: 2013-12-09 | Disposition: A | Discharge status: Home / Self Care | Payer: Self-pay | Attending: Emergency Medicine | Admitting: Emergency Medicine

## 2013-12-09 DIAGNOSIS — S9032XA Contusion of left foot, initial encounter: Secondary | ICD-10-CM | POA: Insufficient documentation

## 2013-12-09 DIAGNOSIS — Z8619 Personal history of other infectious and parasitic diseases: Secondary | ICD-10-CM | POA: Insufficient documentation

## 2013-12-09 DIAGNOSIS — Y9389 Activity, other specified: Secondary | ICD-10-CM | POA: Insufficient documentation

## 2013-12-09 DIAGNOSIS — Z792 Long term (current) use of antibiotics: Secondary | ICD-10-CM | POA: Insufficient documentation

## 2013-12-09 DIAGNOSIS — Y92410 Unspecified street and highway as the place of occurrence of the external cause: Secondary | ICD-10-CM | POA: Insufficient documentation

## 2013-12-09 DIAGNOSIS — Z880 Allergy status to penicillin: Secondary | ICD-10-CM | POA: Insufficient documentation

## 2013-12-09 DIAGNOSIS — Z720 Tobacco use: Secondary | ICD-10-CM | POA: Insufficient documentation

## 2013-12-09 DIAGNOSIS — Z79899 Other long term (current) drug therapy: Secondary | ICD-10-CM | POA: Insufficient documentation

## 2013-12-09 DIAGNOSIS — S99929A Unspecified injury of unspecified foot, initial encounter: Secondary | ICD-10-CM

## 2013-12-09 DIAGNOSIS — Y998 Other external cause status: Secondary | ICD-10-CM | POA: Insufficient documentation

## 2013-12-09 DIAGNOSIS — Z791 Long term (current) use of non-steroidal anti-inflammatories (NSAID): Secondary | ICD-10-CM | POA: Insufficient documentation

## 2013-12-09 MED ORDER — IBUPROFEN 800 MG PO TABS: 800.0000 mg | ORAL_TABLET | Freq: Three times a day (TID) | ORAL | Status: DC

## 2013-12-09 MED ORDER — KETOROLAC TROMETHAMINE 60 MG/2ML IM SOLN
60.0000 mg | Freq: Once | INTRAMUSCULAR | Status: AC
  Administered 2013-12-09: 60 mg via INTRAMUSCULAR
  Filled 2013-12-09: qty 2

## 2013-12-09 MED ORDER — OXYCODONE-ACETAMINOPHEN 5-325 MG PO TABS: 1.0000 | ORAL_TABLET | ORAL | Status: DC | PRN
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
## 2013-12-09 NOTE — ED Provider Notes (Signed)
CSN: [id number]     Arrival date & time 12/09/13  1808 History   First MD Initiated Contact with Patient 12/09/13 1830     Chief Complaint  Patient presents with  . Foot Pain     (Consider location/radiation/quality/duration/timing/severity/associated sxs/prior Treatment) HPI  He is a 28 year old man here for evaluation of left foot pain. He states he was messing around and top of his [employer] explore last night when he jumped off and landed barefoot on asphalt. He had immediate pain and swelling in the bottom of his left foot at the heel. Pain is worse with movement of his toes and weightbearing. He has not tried anything.  He also reports some discomfort in his right ulnar hand. He was seen here about 2-3 weeks ago for wrist laceration that was left to heal by secondary intention. At that time he described some numbness of his ulnar fingers.  The numbness is still present, but now he has pain in the ulnar hand.  He has full range of motion and strength in the hand.  Past Medical History  Diagnosis Date  . Herpes genitalis in men   . Cocaine abuse   . Active smoker   . No pertinent past medical history    Past Surgical History  Procedure Laterality Date  . No past surgeries    . Laparoscopic appendectomy  03/01/2011    Procedure: APPENDECTOMY LAPAROSCOPIC;  Surgeon: Liz MaladyBurke E Thompson, MD;  [facility];  Service: General;  Laterality: N/A;  . Appendectomy  03/01/11   History reviewed. No pertinent family history. History  Substance Use Topics  . Smoking status: Current Every Day Smoker -- 0.50 packs/day for 18 years    Types: Cigarettes  . Smokeless tobacco: Never Used  . Alcohol Use: Yes     Comment: socially    Review of Systems Left foot pain Right hand numbness   Allergies  Penicillins  Home Medications   Prior to Admission medications   Medication Sig Start Date End Date Taking? Authorizing Provider  clindamycin (CLEOCIN) 150 MG capsule Take 3 capsules (450 mg  total) by mouth 3 (three) times daily. 11/24/13   Nicole Pisciotta, PA-C  ibuprofen (ADVIL,MOTRIN) 800 MG tablet Take 1 tablet (800 mg total) by mouth 3 (three) times daily. 12/09/13   Charm RingsErin J Rayelle Armor, MD  Multiple Vitamins-Minerals (MULTIVITAMIN WITH MINERALS) tablet Take 1 tablet by mouth daily.    Historical Provider, MD  oxyCODONE-acetaminophen (PERCOCET/ROXICET) 5-325 MG per tablet Take 1-2 tablets by mouth every 4 (four) hours as needed for severe pain. 12/09/13   Charm RingsErin J Gibran Veselka, MD   BP 135/85 mmHg  Pulse 115  Temp(Src) 98.9 F (37.2 C)  Resp 16  Ht 6' (1.829 m)  Wt 148 lb (67.132 kg)  BMI 20.07 kg/m2  SpO2 98% Physical Exam  Constitutional: He appears well-developed and well-nourished. No distress.  Pulmonary/Chest: Effort normal.  Musculoskeletal:  Left foot: swelling and bruising distal to medial malleolus.  No malleolar tenderness.  Tender over medial and inferior calcaneus.  Brisk cap refill in toes. Right hand: Well healed laceration over dorsal wrist.  Full range of motion of all fingers.  Brisk cap refill in all fingers.    ED Course  Procedures (including critical care time) Labs Review Labs Reviewed - No data to display  Imaging Review Dg Ankle Complete Left  12/09/2013   CLINICAL DATA:  28 year old male with history of trauma after falling off a truck that yesterday evening. Pain in the left  foot and ankle.  EXAM: LEFT ANKLE COMPLETE - 3+ VIEW  COMPARISON:  No priors.  FINDINGS: There is no evidence of fracture, dislocation, or joint effusion. There is no evidence of arthropathy or other focal bone abnormality. Soft tissues are unremarkable.  IMPRESSION: Negative.   Electronically Signed   By: Trudie Reedaniel  Entrikin M.D.   On: 12/09/2013 20:26   Dg Foot Complete Left  12/09/2013   CLINICAL DATA:  Fall off back of truck, left foot/ankle pain, swelling  EXAM: LEFT FOOT - COMPLETE 3+ VIEW  COMPARISON:  None.  FINDINGS: No fracture or dislocation is seen.  The joint spaces are  preserved.  The visualized soft tissues are unremarkable.  IMPRESSION: No fracture or dislocation is seen.   Electronically Signed   By: Charline BillsSriyesh  Krishnan M.D.   On: 12/09/2013 20:27     EKG Interpretation None      MDM   Final diagnoses:  Foot injury    Likely soft tissue injury. With ice, elevation, postop shoe, ibuprofen. Percocet provided to use as needed for severe pain. If no improvement in 2 weeks.     Charm RingsErin J Cheston Coury, MD 12/09/13 2039

## 2013-12-09 NOTE — Discharge Instructions (Signed)
Keep your foot elevated as much as possible. Apply ice 3 times a day. Take ibuprofen 800mg  3 times a day. Use percocet as needed for pain. Wear the post-op shoe when you are moving around.  Follow up if no improvement in 2 weeks.

## 2013-12-17 ENCOUNTER — Encounter (HOSPITAL_COMMUNITY): Payer: Self-pay | Admitting: Family Medicine

## 2013-12-17 ENCOUNTER — Emergency Department (HOSPITAL_COMMUNITY): Payer: Medicaid Other

## 2013-12-17 ENCOUNTER — Emergency Department (HOSPITAL_COMMUNITY)
Admission: EM | Admit: 2013-12-17 | Discharge: 2013-12-17 | Disposition: A | Discharge status: Home / Self Care | Payer: Medicaid Other | Attending: Emergency Medicine | Admitting: Emergency Medicine

## 2013-12-17 DIAGNOSIS — L03011 Cellulitis of right finger: Secondary | ICD-10-CM | POA: Insufficient documentation

## 2013-12-17 DIAGNOSIS — Y93J3 Activity, string instrument playing: Secondary | ICD-10-CM | POA: Insufficient documentation

## 2013-12-17 DIAGNOSIS — S61200A Unspecified open wound of right index finger without damage to nail, initial encounter: Secondary | ICD-10-CM | POA: Insufficient documentation

## 2013-12-17 DIAGNOSIS — Z720 Tobacco use: Secondary | ICD-10-CM | POA: Insufficient documentation

## 2013-12-17 DIAGNOSIS — Z880 Allergy status to penicillin: Secondary | ICD-10-CM | POA: Insufficient documentation

## 2013-12-17 DIAGNOSIS — Y998 Other external cause status: Secondary | ICD-10-CM | POA: Insufficient documentation

## 2013-12-17 DIAGNOSIS — Z8619 Personal history of other infectious and parasitic diseases: Secondary | ICD-10-CM | POA: Insufficient documentation

## 2013-12-17 DIAGNOSIS — Y288XXA Contact with other sharp object, undetermined intent, initial encounter: Secondary | ICD-10-CM | POA: Insufficient documentation

## 2013-12-17 DIAGNOSIS — Z79899 Other long term (current) drug therapy: Secondary | ICD-10-CM | POA: Insufficient documentation

## 2013-12-17 DIAGNOSIS — Y9289 Other specified places as the place of occurrence of the external cause: Secondary | ICD-10-CM | POA: Insufficient documentation

## 2013-12-17 MED ORDER — LIDOCAINE HCL (PF) 1 % IJ SOLN
30.0000 mL | Freq: Once | INTRAMUSCULAR | Status: AC
  Administered 2013-12-17: 30 mL
  Filled 2013-12-17: qty 30

## 2013-12-17 MED ORDER — HYDROCODONE-ACETAMINOPHEN 5-325 MG PO TABS
2.0000 | ORAL_TABLET | Freq: Once | ORAL | Status: AC
Start: 2013-12-17 — End: 2013-12-17
  Administered 2013-12-17: 2 via ORAL
  Filled 2013-12-17: qty 2

## 2013-12-17 MED ORDER — CLINDAMYCIN HCL 150 MG PO CAPS: 450.0000 mg | ORAL_CAPSULE | Freq: Three times a day (TID) | ORAL | Status: DC

## 2013-12-17 NOTE — Discharge Instructions (Signed)
Return to the ER in 2 days for wound recheck. Return sooner if he develop any worsening of redness, worsening swelling, fever, nausea, vomiting, decreased function of finger.  Paronychia Paronychia is an inflammatory reaction involving the folds of the skin surrounding the fingernail. This is commonly caused by an infection in the skin around a nail. The most common cause of paronychia is frequent wetting of the hands (as seen with bartenders, food servers, nurses or others who wet their hands). This makes the skin around the fingernail susceptible to infection by bacteria (germs) or fungus. Other predisposing factors are:  Aggressive manicuring.  Nail biting.  Thumb sucking. The most common cause is a staphylococcal (a type of germ) infection, or a fungal (Candida) infection. When caused by a germ, it usually comes on suddenly with redness, swelling, pus and is often painful. It may get under the nail and form an abscess (collection of pus), or form an abscess around the nail. If the nail itself is infected with a fungus, the treatment is usually prolonged and may require oral medicine for up to one year. Your caregiver will determine the length of time treatment is required. The paronychia caused by bacteria (germs) may largely be avoided by not pulling on hangnails or picking at cuticles. When the infection occurs at the tips of the finger it is called felon. When the cause of paronychia is from the herpes simplex virus (HSV) it is called herpetic whitlow. TREATMENT  When an abscess is present treatment is often incision and drainage. This means that the abscess must be cut open so the pus can get out. When this is done, the following home care instructions should be followed. HOME CARE INSTRUCTIONS   It is important to keep the affected fingers very dry. Rubber or plastic gloves over cotton gloves should be used whenever the hand must be placed in water.  Keep wound clean, dry and dressed as  suggested by your caregiver between warm soaks or warm compresses.  Soak in warm water for fifteen to twenty minutes three to four times per day for bacterial infections. Fungal infections are very difficult to treat, so often require treatment for long periods of time.  For bacterial (germ) infections take antibiotics (medicine which kill germs) as directed and finish the prescription, even if the problem appears to be solved before the medicine is gone.  Only take over-the-counter or prescription medicines for pain, discomfort, or fever as directed by your caregiver. SEEK IMMEDIATE MEDICAL CARE IF:  You have redness, swelling, or increasing pain in the wound.  You notice pus coming from the wound.  You have a fever.  You notice a bad smell coming from the wound or dressing. Document Released: 06/17/2000 Document Revised: 03/16/2011 Document Reviewed: 02/17/2008 [facility] Patient Information 2015 [place]. This information is not intended to replace advice given to you by your health care provider. Make sure you discuss any questions you have with your health care provider.

## 2013-12-17 NOTE — ED Provider Notes (Signed)
CSN: [id number]     Arrival date & time 12/17/13  0919 History   First MD Initiated Contact with Patient 12/17/13 1034     Chief Complaint  Patient presents with  . Finger Injury     (Consider location/radiation/quality/duration/timing/severity/associated sxs/prior Treatment) HPI [name] is a 28 year old male with no significant past medical history who presents the ER with an injury to his finger. Patient reports cutting his finger 5 days ago, and has since noted increased swelling and redness. Patient states the day after he cut his finger, he accidentally spilled paint thinner into the wound, which patient states he noticed increasing pain and swelling after that. Patient reports numbness at the tip of his finger, however states this was due to an old injury and reports improvement in his numbness since the wrist injury. Patient denies fever, nausea, vomiting, weakness in his hand. Patient states his last tetanus shot was 2 weeks ago when he was seen here.  Past Medical History  Diagnosis Date  . Herpes genitalis in men   . Cocaine abuse   . Active smoker   . No pertinent past medical history    Past Surgical History  Procedure Laterality Date  . No past surgeries    . Laparoscopic appendectomy  03/01/2011    Procedure: APPENDECTOMY LAPAROSCOPIC;  Surgeon: Dean MaladyBurke E Thompson, MD;  [facility];  Service: General;  Laterality: N/A;  . Appendectomy  03/01/11   History reviewed. No pertinent family history. History  Substance Use Topics  . Smoking status: Current Every Day Smoker -- 0.50 packs/day for 18 years    Types: Cigarettes  . Smokeless tobacco: Never Used  . Alcohol Use: Yes     Comment: socially    Review of Systems  Constitutional: Negative for fever and chills.  Skin: Positive for rash.      Allergies  Penicillins  Home Medications   Prior to Admission medications   Medication Sig Start Date End Date Taking? Authorizing Provider  ibuprofen  (ADVIL,MOTRIN) 800 MG tablet Take 1 tablet (800 mg total) by mouth 3 (three) times daily. 12/09/13  Yes Dean RingsErin J Honig, MD  Multiple Vitamins-Minerals (MULTIVITAMIN WITH MINERALS) tablet Take 1 tablet by mouth daily.   Yes Historical Provider, MD  clindamycin (CLEOCIN) 150 MG capsule Take 3 capsules (450 mg total) by mouth 3 (three) times daily. 12/17/13   Dean FantasiaJoseph Mcdonald Shariece Viveiros, PA-C  oxyCODONE-acetaminophen (PERCOCET/ROXICET) 5-325 MG per tablet Take 1-2 tablets by mouth every 4 (four) hours as needed for severe pain. Patient not taking: Reported on 12/17/2013 12/09/13   Dean RingsErin J Honig, MD   BP 135/97 mmHg  Pulse 63  Temp(Src) 98.7 F (37.1 C) (Oral)  Resp 18  SpO2 100% Physical Exam  Constitutional: He is oriented to person, place, and time. He appears well-developed and well-nourished. No distress.  HENT:  Head: Normocephalic and atraumatic.  Eyes: Right eye exhibits no discharge. Left eye exhibits no discharge. No scleral icterus.  Neck: Normal range of motion.  Pulmonary/Chest: Effort normal. No respiratory distress.  Musculoskeletal: Normal range of motion.  Neurological: He is alert and oriented to person, place, and time.  Skin: Skin is warm and dry. He is not diaphoretic.  Erythema and purulent discharge noted around wound on radial border of cuticle of second phalanx. Erythema extends around the distal portion of finger to the DIP. Full active and passive range of motion of finger with motor strength 5 out of 5.  Psychiatric: He has a normal mood and  affect.  Nursing note and vitals reviewed.   ED Course  INCISION AND DRAINAGE Date/Time: 12/17/2013 4:04 PM Performed by: Dean Mcdonald Authorized by: Dean Mcdonald Consent: Verbal consent obtained. Risks and benefits: risks, benefits and alternatives were discussed Consent given by: patient Patient identity confirmed: verbally with patient Type: abscess Body area: upper extremity Location details: right index finger Anesthesia:  digital block Local anesthetic: lidocaine 1% without epinephrine Anesthetic total: 5 ml Patient sedated: no Scalpel size: 11 Incision type: single straight Complexity: simple Drainage: serosanguinous and  bloody Drainage amount: moderate Wound treatment: wound left open Patient tolerance: Patient tolerated the procedure well with no immediate complications Comments: Paronychia I&D   (including critical care time) Labs Review Labs Reviewed - No data to display  Imaging Review Dg Finger Index Right  12/17/2013   CLINICAL DATA:  cut his right index finger near the nail bed 3 or 4 days ago on a guitar string as he was playing; he said for the last 2-3 days, it has become painful and entire finger is swelling; pain is radiating into metacarpal region; no prior history of injury or surgery; pt thinks he may have gotten paint thinner inside the cut while at work  EXAM: RIGHT INDEX FINGER 2+V  COMPARISON:  None.  FINDINGS: There is no evidence of fracture or dislocation. There is no evidence of arthropathy or other focal bone abnormality. Soft tissues swelling around the distal phalanx. No radiodense foreign body or subcutaneous gas.  IMPRESSION: Soft tissue swelling without bone abnormality or radiodense foreign body.   Electronically Signed   By: Dean Balmaniel  Mcdonald M.D.   On: 12/17/2013 10:41     EKG Interpretation None      MDM   Final diagnoses:  Paronychia of finger, right    Patient with skin abscess amenable to incision and drainage.  Abscess was not large enough to warrant packing or drain,  wound recheck in 2 days. Encouraged home warm soaks and flushing.  Mild signs of cellulitis is surrounding skin.  Will d/c to home. Pt d/c on Clindamycin. I discussed return precautions with patient patient is agreeable to this plan. I encouraged patient to call or return to ER should he have any worsening of symptoms or should he have any questions or concerns.  BP 135/97 mmHg  Pulse 63   Temp(Src) 98.7 F (37.1 C) (Oral)  Resp 18  SpO2 100%  Signed,  Dean MowJoe Kylyn Sookram, PA-C 4:09 PM  Pt seen and discussed with Dr. Gerhard Munchobert Lockwood, MD   Dean FantasiaJoseph Mcdonald Clearnce Leja, PA-C 12/17/13 1610  Gerhard Munchobert Lockwood, MD 12/17/13 [phone]

## 2013-12-17 NOTE — ED Notes (Signed)
Per pt sts a couple days of right index finger pain and possible infection. 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 Pt finger swollen.

## 2013-12-17 NOTE — ED Notes (Signed)
PA at bedside.

## 2015-02-21 ENCOUNTER — Encounter (HOSPITAL_COMMUNITY): Payer: Self-pay | Admitting: Emergency Medicine

## 2015-02-21 ENCOUNTER — Emergency Department (HOSPITAL_COMMUNITY): Payer: Self-pay

## 2015-02-21 ENCOUNTER — Encounter (HOSPITAL_COMMUNITY): Payer: Self-pay | Admitting: *Deleted

## 2015-02-21 ENCOUNTER — Inpatient Hospital Stay (HOSPITAL_COMMUNITY)
Admission: EM | Admit: 2015-02-21 | Discharge: 2015-02-22 | DRG: 076 | Discharge status: Against Medical Advice | Payer: Self-pay | Attending: Internal Medicine | Admitting: Internal Medicine

## 2015-02-21 ENCOUNTER — Emergency Department (HOSPITAL_COMMUNITY)
Admission: EM | Admit: 2015-02-21 | Discharge: 2015-02-21 | Disposition: A | Discharge status: Home / Self Care | Payer: Medicaid Other | Attending: Emergency Medicine | Admitting: Emergency Medicine

## 2015-02-21 DIAGNOSIS — A879 Viral meningitis, unspecified: Principal | ICD-10-CM | POA: Diagnosis present

## 2015-02-21 DIAGNOSIS — R519 Headache, unspecified: Secondary | ICD-10-CM | POA: Insufficient documentation

## 2015-02-21 DIAGNOSIS — F17210 Nicotine dependence, cigarettes, uncomplicated: Secondary | ICD-10-CM | POA: Insufficient documentation

## 2015-02-21 DIAGNOSIS — D72820 Lymphocytosis (symptomatic): Secondary | ICD-10-CM | POA: Diagnosis present

## 2015-02-21 DIAGNOSIS — Z880 Allergy status to penicillin: Secondary | ICD-10-CM

## 2015-02-21 DIAGNOSIS — R51 Headache: Secondary | ICD-10-CM | POA: Insufficient documentation

## 2015-02-21 DIAGNOSIS — R12 Heartburn: Secondary | ICD-10-CM | POA: Insufficient documentation

## 2015-02-21 DIAGNOSIS — G039 Meningitis, unspecified: Secondary | ICD-10-CM | POA: Diagnosis present

## 2015-02-21 HISTORY — DX: Other psychoactive substance abuse, uncomplicated: F19.10

## 2015-02-21 LAB — PROTEIN, CSF: Total  Protein, CSF: 241 mg/dL — ABNORMAL HIGH (ref 15–45)

## 2015-02-21 LAB — COMPREHENSIVE METABOLIC PANEL
ALBUMIN: 3.8 g/dL (ref 3.5–5.0)
ALT: 24 U/L (ref 17–63)
ANION GAP: 10 (ref 5–15)
AST: 21 U/L (ref 15–41)
Alkaline Phosphatase: 63 U/L (ref 38–126)
BILIRUBIN TOTAL: 0.4 mg/dL (ref 0.3–1.2)
BUN: 9 mg/dL (ref 6–20)
CALCIUM: 9 mg/dL (ref 8.9–10.3)
CHLORIDE: 101 mmol/L (ref 101–111)
CO2: 27 mmol/L (ref 22–32)
Creatinine, Ser: 0.91 mg/dL (ref 0.61–1.24)
GFR calc Af Amer: >60 mL/min (ref >60)
GFR calc non Af Amer: >60 mL/min (ref >60)
GLUCOSE: 130 mg/dL — AB (ref 65–99)
POTASSIUM: 3.8 mmol/L (ref 3.5–5.1)
SODIUM: 138 mmol/L (ref 135–145)
Total Protein: 6.5 g/dL (ref 6.5–8.1)

## 2015-02-21 LAB — CBC WITH DIFFERENTIAL/PLATELET
BASOS ABS: 0 10*3/uL (ref 0.0–0.1)
BASOS PCT: 0 %
Eosinophils Absolute: 0.1 10*3/uL (ref 0.0–0.7)
Eosinophils Relative: 1 %
HCT: 41.5 % (ref 39.0–52.0)
Hemoglobin: 13.5 g/dL (ref 13.0–17.0)
Lymphocytes Relative: 18 %
Lymphs Abs: 1.3 10*3/uL (ref 0.7–4.0)
MCH: 30.8 pg (ref 26.0–34.0)
MCHC: 32.5 g/dL (ref 30.0–36.0)
MCV: 94.5 fL (ref 78.0–100.0)
MONO ABS: 0.5 10*3/uL (ref 0.1–1.0)
Monocytes Relative: 6 %
Neutro Abs: 5.7 10*3/uL (ref 1.7–7.7)
Neutrophils Relative %: 75 %
Platelets: 257 10*3/uL (ref 150–400)
RBC: 4.39 MIL/uL (ref 4.22–5.81)
RDW: 13 % (ref 11.5–15.5)
WBC: 7.6 10*3/uL (ref 4.0–10.5)

## 2015-02-21 LAB — URINALYSIS, ROUTINE W REFLEX MICROSCOPIC
Bilirubin Urine: NEGATIVE
Glucose, UA: NEGATIVE mg/dL
HGB URINE DIPSTICK: NEGATIVE
Ketones, ur: NEGATIVE mg/dL
Leukocytes, UA: NEGATIVE
Nitrite: NEGATIVE
Protein, ur: NEGATIVE mg/dL
SPECIFIC GRAVITY, URINE: 1.015 (ref 1.005–1.030)
pH: 8 (ref 5.0–8.0)

## 2015-02-21 LAB — INFLUENZA PANEL BY PCR (TYPE A & B)
H1N1FLUPCR: NOT DETECTED
INFLAPCR: NEGATIVE
INFLBPCR: NEGATIVE

## 2015-02-21 LAB — CSF CELL COUNT WITH DIFFERENTIAL
Lymphs, CSF: 96 % — ABNORMAL HIGH (ref 40–80)
Lymphs, CSF: 98 % — ABNORMAL HIGH (ref 40–80)
Monocyte-Macrophage-Spinal Fluid: 2 % — ABNORMAL LOW (ref 15–45)
Monocyte-Macrophage-Spinal Fluid: 4 % — ABNORMAL LOW (ref 15–45)
RBC Count, CSF: 222 /mm3 — ABNORMAL HIGH
RBC Count, CSF: 3 /mm3 — ABNORMAL HIGH
TUBE #: 4
Tube #: 1
WBC CSF: 178 /mm3 — AB (ref 0–5)
WBC CSF: 214 /mm3 — AB (ref 0–5)

## 2015-02-21 LAB — PATHOLOGIST SMEAR REVIEW

## 2015-02-21 LAB — GLUCOSE, CSF: GLUCOSE CSF: 52 mg/dL (ref 40–70)

## 2015-02-21 MED ORDER — ACETAMINOPHEN 325 MG PO TABS
650.0000 mg | ORAL_TABLET | Freq: Four times a day (QID) | ORAL | Status: DC | PRN
Start: 2015-02-21 — End: 2015-02-22
  Filled 2015-02-21: qty 2

## 2015-02-21 MED ORDER — PROCHLORPERAZINE EDISYLATE 5 MG/ML IJ SOLN
10.0000 mg | Freq: Once | INTRAMUSCULAR | Status: AC
  Administered 2015-02-21: 10 mg via INTRAVENOUS
  Filled 2015-02-21: qty 2

## 2015-02-21 MED ORDER — SODIUM CHLORIDE 0.9 % IV SOLN
INTRAVENOUS | Status: DC
  Administered 2015-02-21: 19:00:00 via INTRAVENOUS

## 2015-02-21 MED ORDER — VANCOMYCIN HCL IN DEXTROSE 1-5 GM/200ML-% IV SOLN
1000.0000 mg | Freq: Once | INTRAVENOUS | Status: DC
  Filled 2015-02-21: qty 200

## 2015-02-21 MED ORDER — ACETAMINOPHEN 650 MG RE SUPP: 650.0000 mg | Freq: Four times a day (QID) | RECTAL | Status: DC | PRN

## 2015-02-21 MED ORDER — SODIUM CHLORIDE 0.9 % IV BOLUS (SEPSIS)
1000.0000 mL | Freq: Once | INTRAVENOUS | Status: AC
  Administered 2015-02-21: 1000 mL via INTRAVENOUS

## 2015-02-21 MED ORDER — KETOROLAC TROMETHAMINE 30 MG/ML IJ SOLN
30.0000 mg | Freq: Once | INTRAMUSCULAR | Status: AC
  Administered 2015-02-21: 30 mg via INTRAVENOUS
  Filled 2015-02-21: qty 1

## 2015-02-21 MED ORDER — CEFTRIAXONE SODIUM 2 G IJ SOLR
2.0000 g | Freq: Two times a day (BID) | INTRAMUSCULAR | Status: DC
  Filled 2015-02-21: qty 2

## 2015-02-21 MED ORDER — DIPHENHYDRAMINE HCL 50 MG/ML IJ SOLN
25.0000 mg | Freq: Once | INTRAMUSCULAR | Status: AC
  Administered 2015-02-21: 25 mg via INTRAVENOUS
  Filled 2015-02-21: qty 1

## 2015-02-21 MED ORDER — KETOROLAC TROMETHAMINE 30 MG/ML IJ SOLN
30.0000 mg | Freq: Four times a day (QID) | INTRAMUSCULAR | Status: DC | PRN
  Administered 2015-02-21: 30 mg via INTRAVENOUS
  Filled 2015-02-21: qty 1

## 2015-02-21 NOTE — ED Notes (Signed)
Patient is aware we need urine 

## 2015-02-21 NOTE — ED Notes (Signed)
MD at bedside. 

## 2015-02-21 NOTE — ED Notes (Signed)
Pt made aware of need for urine sample.  

## 2015-02-21 NOTE — Discharge Instructions (Signed)

## 2015-02-21 NOTE — ED Notes (Signed)
Lab values given to MD

## 2015-02-21 NOTE — H&P (Signed)
History and Physical:    Dean Mcdonald   ZOX:[id number] DOB: [birth year] DOA: 02/21/2015  Referring MD/provider: Lyndal Pulley, M.D. PCP: No PCP Per Patient   Chief Complaint: Headache 2 days  History of Present Illness:   Dean Mcdonald is an 30 y.o. male who presents to the emergency room with complaints of headache which he describes this feeling as if it's in the middle of his head. He describes the pain as a constant, sharp, nonradiating pain at an intensity of cannot attend. He states that the pain is aggravated by light and movement of his neck. He is unable to identify any palliative features of the headache however denies any vomiting or nausea. He states that he took Advil to try to alleviate the headache however was ineffective.    He denies any fevers, chills, vomiting, diarrhea, nausea. As far as he knows he has had no sick contacts. He denies any IV drug use.  In the emergency room he had a lumbar puncture performed which showed findings consistent with a viral meningitis. ROS:   ROS  A comprehensive review of systems was completed and all systems are negative except as noted in the history of present illness.   Past Medical History:   Past Medical History  Diagnosis Date  . Herpes genitalis in men   . Active smoker   . No pertinent past medical history   . Substance abuse     Past Surgical History:   Past Surgical History  Procedure Laterality Date  . No past surgeries    . Laparoscopic appendectomy  03/01/2011    Procedure: APPENDECTOMY LAPAROSCOPIC;  Surgeon: Liz Malady, MD;  [facility]: [facility];  Service: General;  Laterality: N/A;  . Appendectomy  03/01/11    Social History:   Social History   Social History  . Marital Status: Legally Separated    Spouse Name: N/A  . Number of Children: N/A  . Years of Education: N/A   Occupational History  . Not on file.   Social History Main Topics  . Smoking status: Current Every Day Smoker --  1.00 packs/day for 18 years    Types: Cigarettes  . Smokeless tobacco: Never Used  . Alcohol Use: Yes     Comment: socially  . Drug Use: Yes    Special: Marijuana  . Sexual Activity: Yes   Other Topics Concern  . Not on file   Social History Narrative    Family history:   Family History  Problem Relation Age of Onset  . Family history unknown: Yes    Allergies   Penicillins  Current Medications:   Prior to Admission medications   Medication Sig Start Date End Date Taking? Authorizing Provider  Pseudoephedrine-APAP-DM (DAYQUIL MULTI-SYMPTOM COLD/FLU PO) Take 30 mLs by mouth every 6 (six) hours as needed (cold/flu symptoms).   Yes Historical Provider, MD    Physical Exam:   Filed Vitals:   02/21/15 0332 02/21/15 0958 02/21/15 1054  BP: 128/82  128/84  Pulse: 63  66  Temp: 97.9 F (36.6 C)    TempSrc: Oral    Resp: 18  16  Height:   (1.854 m)   Weight:  148 lb (67.132 kg)   SpO2: 100%  98%     Physical Exam: Blood pressure 128/84, pulse 66, temperature 97.9 F (36.6 C), temperature source Oral, resp. rate 16, height  (1.854 m), weight 148 lb (67.132 kg), SpO2 98 %.   General: Alert,  awake, oriented x3, in moderate distress due to headache.  HEENT: [place]/AT PEERL, EOMI, anicteric. There is some mild photophobia present Neck: Trachea midline, no masses, no thyromegal,y no JVD, no carotid bruit OROPHARYNX: Moist, No exudate/ erythema/lesions.  Heart: Regular rate and rhythm, without murmurs, rubs, gallops or S3. PMI non-displaced. Exam reveals no decreased pulses. Pulmonary/Chest: Normal effort. Breath sounds normal. No. Apnea. Clear to auscultation,no stridor,  no wheezing and no rhonchi noted. No respiratory distress and no tenderness noted. Abdomen: Soft, nontender, nondistended, normal bowel sounds, no masses no hepatosplenomegaly noted. No fluid wave and no ascites. There is no guarding or rebound. Neuro: Alert and oriented to person, place and  time. Normal motor skills, Displays no atrophy or tremors and exhibits normal muscle tone.  No focal neurological deficits noted cranial nerves II through XII grossly intact. No sensory deficit noted.  Strength at baseline in bilateral upper and lower extremities. Pt has neck stiffness and significant pain with neck flexion. Gait not assessed due to bedrest after LP.  Musculoskeletal: No warm swelling or erythema around joints, no spinal tenderness noted. Psychiatric: Patient alert and oriented x3, good insight and cognition, good recent to remote recall. ood, memory, affect and judgement normal Lymph node survey: No cervical axillary or inguinal lymphadenopathy noted. Skin: Skin is warm and dry. No bruising, no ecchymosis and no rash noted. Pt is not diaphoretic. No erythema. No pallor    Data Review:    Labs: Basic Metabolic Panel:  Recent Labs Lab 02/21/15 0402  NA 138  K 3.8  CL 101  CO2 27  GLUCOSE 130*  BUN 9  CREATININE 0.91  CALCIUM 9.0   Liver Function Tests:  Recent Labs Lab 02/21/15 0402  AST 21  ALT 24  ALKPHOS 63  BILITOT 0.4  PROT 6.5  ALBUMIN 3.8   No results for input(s): LIPASE, AMYLASE in the last 168 hours. No results for input(s): AMMONIA in the last 168 hours. CBC:  Recent Labs Lab 02/21/15 0402  WBC 7.6  NEUTROABS 5.7  HGB 13.5  HCT 41.5  MCV 94.5  PLT 257   Cardiac Enzymes: No results for input(s): CKTOTAL, CKMB, CKMBINDEX, TROPONINI in the last 168 hours.  BNP (last 3 results) No results for input(s): PROBNP in the last 8760 hours. CBG: No results for input(s): GLUCAP in the last 168 hours.  Radiographic Studies: Ct Head Wo Contrast  02/21/2015  CLINICAL DATA:  Increasing frontal headaches for a few days. Low grade fever. Photophobia. EXAM: CT HEAD WITHOUT CONTRAST TECHNIQUE: Contiguous axial images were obtained from the base of the skull through the vertex without intravenous contrast. COMPARISON:  None. FINDINGS: Ventricles and  sulci appear symmetrical. No ventricular dilatation. No mass effect or midline shift. No abnormal extra-axial fluid collections. Gray-white matter junctions are distinct. Basal cisterns are not effaced. No evidence of acute intracranial hemorrhage. No depressed skull fractures. Mucosal thickening in the paranasal sinuses with opacification of some of the ethmoid air cells. Mastoid air cells are not opacified. IMPRESSION: No acute intracranial abnormalities. Inflammatory changes in the paranasal sinuses. Electronically Signed   By: Burman Nieves M.D.   On: 02/21/2015 04:31   *I have personally reviewed the images above*  Assessment/Plan:   Active Problems:  Meningitis: Preliminary review of CSF findings are indicative of a viral meningitis. So will provide supportive care and hold on anti-viral medications. Viral PCR's and bacterial cultures of the CSF have been sent and we will await the results. We'll do neuro checks every  2 hours and if patient shows any signs of deterioration will empirically start acyclovir. Currently the patient is complaining that he is hungry and I have asked the nurses to provide him something to eat. If he tolerates will continue on a regular diet. -Continue droplet precautions for now. -Supportive care with IV fluids, Tylenol for headache. If Tylenol ineffective will consider IV Solu-Medrol.  DVT prophylaxis - SCD's ordered.  Code Status / Family Communication / Disposition Plan:   Code Status: Full. Family Communication: N/A Disposition Plan: Home when stable.  Attestation regarding necessity of inpatient status:   The appropriate admission status for this patient is INPATIENT. Inpatient status is judged to be reasonable and necessary in order to provide the required intensity of service to ensure the patient's safety. The patient's presenting symptoms, physical exam findings, and initial radiographic and laboratory data in the context of their chronic  comorbidities is felt to place them at high risk for further clinical deterioration. Furthermore, it is not anticipated that the patient will be medically stable for discharge from the hospital within 2 midnights of admission. The following factors support the admission status of inpatient.   -The patient's presenting symptoms include Headache - The worrisome physical exam findings include Photophobia, Neck stiffness. - The initial radiographic and laboratory data are worrisome because of Findings consistent with Meningitis. - The chronic co-morbidities include None. - Patient requires inpatient status due to high intensity of service, high risk for further deterioration and high frequency of surveillance required. - I certify that at the point of admission it is my clinical judgment that the patient will require inpatient hospital care spanning beyond 2 midnights from the point of admission.   Time spent: One hour  MATTHEWS,MICHELLE A.  Pager [phone]   If 7PM-7AM, please contact night-coverage [email or web address] Password [email or web address] 02/21/2015, 12:01 PM

## 2015-02-21 NOTE — Progress Notes (Signed)
Please call to make a follow up appointment after discharge. This is the listed medicaid [place] access primary care provider (pcp) assigned by medicaid. If this is not your preference, you will need to contact DSS [phone] to make changesCM spoke with pt who confirms uninsured [employer] resident with no pcp.  CM discussed and provided written information for uninsured accepting pcps, discussed the importance of pcp vs EDP services for f/u care, www.needymeds.org, www.goodrx.com, discounted pharmacies and other [employer] such as [facility] , [employer], affordable care act, financial assistance, uninsured dental services, [place] med assist, DSS and  health department  Reviewed resources for [employer] uninsured accepting pcps like [facility], family medicine at [facility], [facility], [facility], local urgent care centers, [facility], [facility] family practice, general medical clinics, family services of the [place], [facility] urgent care plus others, medication resources, [facility] out patient [facility]ies and housing Pt voiced understanding and appreciation of resources provided   Provided [facility] contact information Pt agreed to a referral Cm completed referral Pt to be contact by [facility] clinical liason  Pt with dx meningitis  Pt states he would like to call family/friends but can not recall numbers without his phone Cm assisted pt to find  [employer]  [address] [place], [place] [address]  [phone]  And later his girlfriend arrived

## 2015-02-21 NOTE — ED Notes (Signed)
The pt is c/0 a headache heartburn  n v for 2 days.  He does not know if he has had a temp

## 2015-02-21 NOTE — ED Provider Notes (Signed)
Care assumed from Dr. Blinda Leatherwood at 0800 with plan for f/u results of LP to evaluate for bacterial vs viral meningitis.   Results:  BP 128/82 mmHg  Pulse 63  Temp(Src) 97.9 F (36.6 C) (Oral)  Resp 18  SpO2 100%  Results for orders placed or performed during the hospital encounter of 02/21/15  CSF culture  Result Value Ref Range   Specimen Description CSF    Special Requests NONE    Gram Stain      CYTOSPIN WBC PRESENT, PREDOMINANTLY MONONUCLEAR NO ORGANISMS SEEN Gram Stain Report Called to,Read Back By and Verified With: Honor Loh RN AT [facility] ON 02.16.17 BY SHUEA    Culture PENDING    Report Status PENDING   CBC with Differential/Platelet  Result Value Ref Range   WBC 7.6 4.0 - 10.5 K/uL   RBC 4.39 4.22 - 5.81 MIL/uL   Hemoglobin 13.5 13.0 - 17.0 g/dL   HCT 40.9 81.1 - 91.4 %   MCV 94.5 78.0 - 100.0 fL   MCH 30.8 26.0 - 34.0 pg   MCHC 32.5 30.0 - 36.0 g/dL   RDW 78.2 95.6 - 21.3 %   Platelets 257 150 - 400 K/uL   Neutrophils Relative % 75 %   Neutro Abs 5.7 1.7 - 7.7 K/uL   Lymphocytes Relative 18 %   Lymphs Abs 1.3 0.7 - 4.0 K/uL   Monocytes Relative 6 %   Monocytes Absolute 0.5 0.1 - 1.0 K/uL   Eosinophils Relative 1 %   Eosinophils Absolute 0.1 0.0 - 0.7 K/uL   Basophils Relative 0 %   Basophils Absolute 0.0 0.0 - 0.1 K/uL  Comprehensive metabolic panel  Result Value Ref Range   Sodium 138 135 - 145 mmol/L   Potassium 3.8 3.5 - 5.1 mmol/L   Chloride 101 101 - 111 mmol/L   CO2 27 22 - 32 mmol/L   Glucose, Bld 130 (H) 65 - 99 mg/dL   BUN 9 6 - 20 mg/dL   Creatinine, Ser 0.86 0.61 - 1.24 mg/dL   Calcium 9.0 8.9 - 57.8 mg/dL   Total Protein 6.5 6.5 - 8.1 g/dL   Albumin 3.8 3.5 - 5.0 g/dL   AST 21 15 - 41 U/L   ALT 24 17 - 63 U/L   Alkaline Phosphatase 63 38 - 126 U/L   Total Bilirubin 0.4 0.3 - 1.2 mg/dL   GFR calc non Af Amer >60 >60 mL/min   GFR calc Af Amer >60 >60 mL/min   Anion gap 10 5 - 15  Influenza panel by PCR (type A & B, H1N1)  Result Value Ref  Range   Influenza A By PCR NEGATIVE NEGATIVE   Influenza B By PCR NEGATIVE NEGATIVE   H1N1 flu by pcr NOT DETECTED NOT DETECTED  CSF cell count with differential collection tube #: 1  Result Value Ref Range   Tube # 1    Color, CSF COLORLESS COLORLESS   Appearance, CSF CLEAR (A) CLEAR   Supernatant NOT INDICATED    RBC Count, CSF 222 (H) 0 /cu mm   WBC, CSF 178 (HH) 0 - 5 /cu mm   Lymphs, CSF 96 (H) 40 - 80 %   Monocyte-Macrophage-Spinal Fluid 4 (L) 15 - 45 %  CSF cell count with differential collection tube #: 4  Result Value Ref Range   Tube # 4    Color, CSF COLORLESS COLORLESS   Appearance, CSF CLEAR (A) CLEAR   Supernatant NOT INDICATED  RBC Count, CSF 3 (H) 0 /cu mm   WBC, CSF 214 (HH) 0 - 5 /cu mm   Lymphs, CSF 98 (H) 40 - 80 %   Monocyte-Macrophage-Spinal Fluid 2 (L) 15 - 45 %  Glucose, CSF  Result Value Ref Range   Glucose, CSF 52 40 - 70 mg/dL  Protein, CSF  Result Value Ref Range   Total  Protein, CSF 241 (H) 15 - 45 mg/dL    Ct Head Wo Contrast  02/21/2015  CLINICAL DATA:  Increasing frontal headaches for a few days. Low grade fever. Photophobia. EXAM: CT HEAD WITHOUT CONTRAST TECHNIQUE: Contiguous axial images were obtained from the base of the skull through the vertex without intravenous contrast. COMPARISON:  None. FINDINGS: Ventricles and sulci appear symmetrical. No ventricular dilatation. No mass effect or midline shift. No abnormal extra-axial fluid collections. Gray-white matter junctions are distinct. Basal cisterns are not effaced. No evidence of acute intracranial hemorrhage. No depressed skull fractures. Mucosal thickening in the paranasal sinuses with opacification of some of the ethmoid air cells. Mastoid air cells are not opacified. IMPRESSION: No acute intracranial abnormalities. Inflammatory changes in the paranasal sinuses. Electronically Signed   By: Burman Nieves M.D.   On: 02/21/2015 04:31    Radiology and laboratory examinations were reviewed  by me and used in medical decision making if performed.   MDM:  Results of CSF concerning for a lymphocytic pleocytosis and elevated protein suggesting a viral meningitis. While the white blood cells are dramatically elevated, the patient has no history of herpes virus, no signs of encephalitis, no current fever or leukocytosis, no clinical signs or symptoms of bacterial meningitis. Studies ordered to further evaluate for potential etiologies of this finding. Patient remains with improved symptoms following migraine cocktail. Deferred IV antibiotics until discussion with hospitalist regarding observation pending CSF culture results +/- empiric treatment.  Patient will be placed in observation pending CSF culture data and further workup, hospitalist will evaluate patient in the emergency department.  Diagnoses that have been ruled out:  None  Diagnoses that are still under consideration:  None  Final diagnoses:  Headache, unspecified headache type  Viral meningitis     Lyndal Pulley, MD 02/21/15 [phone]

## 2015-02-21 NOTE — ED Provider Notes (Signed)
CSN: [id number]     Arrival date & time 02/21/15  4098 History  By signing my name below, I, Budd Palmer, attest that this documentation has been prepared under the direction and in the presence of Gilda Crease, MD. Electronically Signed: Budd Palmer, ED Scribe. 02/21/2015. 3:52 AM.    Chief Complaint  Patient presents with  . Fever  . Headache   The history is provided by the patient. No language interpreter was used.   HPI Comments: Dean Mcdonald is a 30 y.o. male smoker who presents to the Emergency Department complaining of constant, worsening, frontal HA onset a few days ago and fever (Tmax 99.7) onset 1 day ago. He reports associated chills, nausea, and photophobia. He denies a PMHX of migraines or of similar headaches. Pt denies vomiting, cough, and cold symptoms.   Past Medical History  Diagnosis Date  . Herpes genitalis in men   . Cocaine abuse   . Active smoker   . No pertinent past medical history    Past Surgical History  Procedure Laterality Date  . No past surgeries    . Laparoscopic appendectomy  03/01/2011    Procedure: APPENDECTOMY LAPAROSCOPIC;  Surgeon: Liz Malady, MD;  Location: [facility] OR;  Service: General;  Laterality: N/A;  . Appendectomy  03/01/11   History reviewed. No pertinent family history. Social History  Substance Use Topics  . Smoking status: Current Every Day Smoker -- 0.50 packs/day for 18 years    Types: Cigarettes  . Smokeless tobacco: Never Used  . Alcohol Use: Yes     Comment: socially    Review of Systems  Constitutional: Positive for fever and chills.  Eyes: Positive for photophobia.  Gastrointestinal: Positive for nausea. Negative for vomiting.  Neurological: Positive for headaches.  All other systems reviewed and are negative.   Allergies  Penicillins  Home Medications   Prior to Admission medications   Medication Sig Start Date End Date Taking? Authorizing Provider  Pseudoephedrine-APAP-DM (DAYQUIL  MULTI-SYMPTOM COLD/FLU PO) Take 30 mLs by mouth every 6 (six) hours as needed (cold/flu symptoms).   Yes Historical Provider, MD   BP 128/82 mmHg  Pulse 63  Temp(Src) 97.9 F (36.6 C) (Oral)  Resp 18  SpO2 100% Physical Exam  Constitutional: He is oriented to person, place, and time. He appears well-developed and well-nourished. No distress.  HENT:  Head: Normocephalic and atraumatic.  Right Ear: Hearing normal.  Left Ear: Hearing normal.  Nose: Nose normal.  Mouth/Throat: Oropharynx is clear and moist and mucous membranes are normal.  Eyes: Conjunctivae and EOM are normal. Pupils are equal, round, and reactive to light.  Neck: Normal range of motion. Neck supple.  Cardiovascular: Regular rhythm, S1 normal and S2 normal.  Exam reveals no gallop and no friction rub.   No murmur heard. Pulmonary/Chest: Effort normal and breath sounds normal. No respiratory distress. He exhibits no tenderness.  Abdominal: Soft. Normal appearance and bowel sounds are normal. There is no hepatosplenomegaly. There is no tenderness. There is no rebound, no guarding, no tenderness at McBurney's point and negative Murphy's sign. No hernia.  Musculoskeletal: Normal range of motion.  Neurological: He is alert and oriented to person, place, and time. He has normal strength. No cranial nerve deficit or sensory deficit. Coordination normal. GCS eye subscore is 4. GCS verbal subscore is 5. GCS motor subscore is 6.  Skin: Skin is warm, dry and intact. No rash noted. No cyanosis.  Psychiatric: He has a normal mood and affect.  His speech is normal and behavior is normal. Thought content normal.  Nursing note and vitals reviewed.   ED Course  .Lumbar Puncture Date/Time: 02/21/2015 7:38 AM Performed by: Gilda Crease Authorized by: Gilda Crease Consent: Verbal consent obtained. Written consent obtained. Risks and benefits: risks, benefits and alternatives were discussed Patient understanding:  patient states understanding of the procedure being performed Patient consent: the patient's understanding of the procedure matches consent given Procedure consent: procedure consent matches procedure scheduled Relevant documents: relevant documents present and verified Test results: test results available and properly labeled Site marked: the operative site was marked Imaging studies: imaging studies available Required items: required blood products, implants, devices, and special equipment available Patient identity confirmed: arm band, verbally with patient and hospital-assigned identification number Time out: Immediately prior to procedure a "time out" was called to verify the correct patient, procedure, equipment, support staff and site/side marked as required. Indications: evaluation for infection Anesthesia: local infiltration Local anesthetic: lidocaine 1% without epinephrine Anesthetic total: 3 ml Patient sedated: no Preparation: Patient was prepped and draped in the usual sterile fashion. Lumbar space: L3-L4 interspace Patient's position: left lateral decubitus Needle gauge: 20 Needle type: spinal needle - Quincke tip Needle length: 3.5 in Number of attempts: 3 Opening pressure: 21 cm H2O Fluid appearance: clear Tubes of fluid: 4 Total volume: 4 ml Post-procedure: site cleaned, pressure dressing applied and adhesive bandage applied Patient tolerance: Patient tolerated the procedure well with no immediate complications    DIAGNOSTIC STUDIES: Oxygen Saturation is 100% on RA, normal by my interpretation.    COORDINATION OF CARE: 3:49 AM - Discussed plans to order diagnostic studies and imaging. Pt advised of plan for treatment and pt agrees.  Labs Review Labs Reviewed  COMPREHENSIVE METABOLIC PANEL - Abnormal; Notable for the following:    Glucose, Bld 130 (*)    All other components within normal limits  CSF CULTURE  CBC WITH DIFFERENTIAL/PLATELET  INFLUENZA PANEL  BY PCR (TYPE A & B, H1N1)  CSF CELL COUNT WITH DIFFERENTIAL  CSF CELL COUNT WITH DIFFERENTIAL  GLUCOSE, CSF  PROTEIN, CSF    Imaging Review Ct Head Wo Contrast  02/21/2015  CLINICAL DATA:  Increasing frontal headaches for a few days. Low grade fever. Photophobia. EXAM: CT HEAD WITHOUT CONTRAST TECHNIQUE: Contiguous axial images were obtained from the base of the skull through the vertex without intravenous contrast. COMPARISON:  None. FINDINGS: Ventricles and sulci appear symmetrical. No ventricular dilatation. No mass effect or midline shift. No abnormal extra-axial fluid collections. Gray-white matter junctions are distinct. Basal cisterns are not effaced. No evidence of acute intracranial hemorrhage. No depressed skull fractures. Mucosal thickening in the paranasal sinuses with opacification of some of the ethmoid air cells. Mastoid air cells are not opacified. IMPRESSION: No acute intracranial abnormalities. Inflammatory changes in the paranasal sinuses. Electronically Signed   By: Burman Nieves M.D.   On: 02/21/2015 04:31   I have personally reviewed and evaluated these images and lab results as part of my medical decision-making.   EKG Interpretation None      MDM   Final diagnoses:  Headache, unspecified headache type    Patient presents to the ER for evaluation of headache. Patient reports severe constant headache for 2 days. He reports he felt hot and sweaty yesterday, but did not take his temperature. He thinks he had a fever. Examination reveals a supple neck without any signs of meningismus. CT head was unremarkable. Lab work was unremarkable. This time the suspicion  of fever and headache, however, lumbar puncture was recommended. He did consent verbally and his significant other signed consent. Procedure was performed. Will sign out to oncoming ER physician to follow-up on CSF studies. Anticipate completely normal studies and discharge of patient.  I personally performed the  services described in this documentation, which was scribed in my presence. The recorded information has been reviewed and is accurate.   Gilda Crease, MD 02/21/15 [phone]

## 2015-02-21 NOTE — ED Notes (Signed)
Patient is aware we need urine.  Food given to patient per MD

## 2015-02-21 NOTE — ED Notes (Signed)
Pt left. 

## 2015-02-21 NOTE — ED Notes (Signed)
Report given to Monica, RN.

## 2015-02-21 NOTE — ED Notes (Signed)
Visitor states pt had a fever yesterday and today he has been c/o severe headache pain  Pt states he has been feeling very hot then cold  Pt states his headache hurts so bad it makes him feel sick on his stomach

## 2015-02-21 NOTE — Progress Notes (Signed)
Entered in d/c instructions  Please use the resources provided to you in emergency room by case manager to assist with doctor for follow up On 02/26/2015 A referral for you has been sent to [facility] if you have not received a call in 3 days you may contact them Call Scherry Ran at [phone] Tuesday-Friday [email or web address] These [place] uninsured resources provide possible primary care providers, resources for discounted medications, housing, dental resources, affordable care act information, plus other resources for [facility]

## 2015-02-22 DIAGNOSIS — A879 Viral meningitis, unspecified: Principal | ICD-10-CM

## 2015-02-22 LAB — HERPES SIMPLEX VIRUS(HSV) DNA BY PCR
HSV 1 DNA: NEGATIVE
HSV 2 DNA: NEGATIVE

## 2015-02-22 LAB — URINE CULTURE

## 2015-02-22 LAB — HIV ANTIBODY (ROUTINE TESTING W REFLEX): HIV Screen 4th Generation wRfx: NONREACTIVE

## 2015-02-22 LAB — VDRL, CSF: VDRL Quant, CSF: NONREACTIVE

## 2015-02-22 MED ORDER — BUTALBITAL-APAP-CAFFEINE 50-325-40 MG PO TABS
1.0000 | ORAL_TABLET | Freq: Four times a day (QID) | ORAL | Status: DC | PRN
  Administered 2015-02-22: 1 via ORAL
  Filled 2015-02-22: qty 1

## 2015-02-22 NOTE — Care Management Note (Signed)
Case Management Note  Patient Details  Name: BRAVLIO LUCA MRN: [id number] Date of Birth: 1985-12-04  Subjective/Objective:    Admitted for viral meningitis.                 Action/Plan: Discharge planning, no HH needs identified. Left AMA  Expected Discharge Date:   (UNKNOWN)               Expected Discharge Plan:  Home/Self Care  In-House Referral:  NA  Discharge planning Services  CM Consult  Post Acute Care Choice:  NA Choice offered to:  NA  DME Arranged:  N/A DME Agency:  NA  HH Arranged:  NA HH Agency:  NA  Status of Service:  Completed, signed off  Medicare Important Message Given:    Date Medicare IM Given:    Medicare IM give by:    Date Additional Medicare IM Given:    Additional Medicare Important Message give by:     If discussed at Long Length of Stay Meetings, dates discussed:    Additional Comments:  Alexis Goodell, RN 02/22/2015, 1:00 PM [phone]

## 2015-02-22 NOTE — Progress Notes (Signed)
02/22/15 1200: Patient just left AMA. Dr Allena Katz notified via Loretha Stapler. Lina Sar, RN

## 2015-02-25 LAB — ENTEROVIRUS PCR: Enterovirus PCR: NEGATIVE

## 2015-02-25 LAB — CSF CULTURE W GRAM STAIN

## 2015-02-25 LAB — CSF CULTURE: CULTURE: NO GROWTH

## 2015-02-25 NOTE — Discharge Summary (Addendum)
[facility] Discharge Summary   Patient: Dean Mcdonald ZOX:[id number]   PCP: No PCP Per Patient DOB: August 17, 1985   Date of admission: 02/21/2015   Date of discharge: 02/22/2015    Discharge Diagnoses:  Active Problems:   Viral meningitis   Meningitis   Discharge Condition: fair  History of present illness: As per the H and P dictated on admission, "Dean Mcdonald is an 30 y.o. male who presents to the emergency room with complaints of headache which he describes this feeling as if it's in the middle of his head. He describes the pain as a constant, sharp, nonradiating pain at an intensity of cannot attend. He states that the pain is aggravated by light and movement of his neck. He is unable to identify any palliative features of the headache however denies any vomiting or nausea. He states that he took Advil to try to alleviate the headache however was ineffective.   He denies any fevers, chills, vomiting, diarrhea, nausea. As far as he knows he has had no sick contacts. He denies any IV drug use.  In the emergency room he had a lumbar puncture performed which showed findings consistent with a viral meningitis"  Hospital Course:  Summary of his active problems in the hospital is as following. Active Problems:   Viral meningitis   Meningitis  patient presented with headache. He also complained of some photophobia. A lumbar puncture was consistent with elevated WBC with lymphocytosis. Elevated protein level. It was consistent with aseptic or viral meningitis, but possibility of early bacterial meningitis cannot be ruled out.  Patient's headache improved and he continues to have some photophobia next day. Before patient's case was discussed with any other specialist and before workup for the lumbar puncture was completed the patient left AMA. A verbalized the understanding that untreated bacterial meningitis can cause serious harm including death.  patient left AMA  Procedures  and Results:  Lumbar puncture   Consultations:  none  The results of significant diagnostics from this hospitalization (including imaging, microbiology, ancillary and laboratory) are listed below for reference.    Significant Diagnostic Studies: Ct Head Wo Contrast  02/21/2015  CLINICAL DATA:  Increasing frontal headaches for a few days. Low grade fever. Photophobia. EXAM: CT HEAD WITHOUT CONTRAST TECHNIQUE: Contiguous axial images were obtained from the base of the skull through the vertex without intravenous contrast. COMPARISON:  None. FINDINGS: Ventricles and sulci appear symmetrical. No ventricular dilatation. No mass effect or midline shift. No abnormal extra-axial fluid collections. Gray-white matter junctions are distinct. Basal cisterns are not effaced. No evidence of acute intracranial hemorrhage. No depressed skull fractures. Mucosal thickening in the paranasal sinuses with opacification of some of the ethmoid air cells. Mastoid air cells are not opacified. IMPRESSION: No acute intracranial abnormalities. Inflammatory changes in the paranasal sinuses. Electronically Signed   By: Burman Nieves M.D.   On: 02/21/2015 04:31    Microbiology: Recent Results (from the past 240 hour(s))  CSF culture     Status: None (Preliminary result)   Collection Time: 02/21/15  7:25 AM  Result Value Ref Range Status   Specimen Description CSF  Final   Special Requests NONE  Final   Gram Stain   Final    CYTOSPIN WBC PRESENT, PREDOMINANTLY MONONUCLEAR NO ORGANISMS SEEN Gram Stain Report Called to,Read Back By and Verified With: Honor Loh RN AT 0810 ON 02.16.17 BY SHUEA    Culture   Final    NO GROWTH 3 DAYS Performed at  [facility]    Report Status PENDING  Incomplete  Culture, blood (routine x 2)     Status: None (Preliminary result)   Collection Time: 02/21/15  9:41 AM  Result Value Ref Range Status   Specimen Description BLOOD LEFT ANTECUBITAL  Final   Special Requests BOTTLES  DRAWN AEROBIC AND ANAEROBIC  Final   Culture   Final    NO GROWTH 3 DAYS Performed at [facility]    Report Status PENDING  Incomplete  Culture, blood (routine x 2)     Status: None (Preliminary result)   Collection Time: 02/21/15  9:41 AM  Result Value Ref Range Status   Specimen Description BLOOD RIGHT FOREARM  Final   Special Requests BOTTLES DRAWN AEROBIC AND ANAEROBIC  Final   Culture   Final    NO GROWTH 3 DAYS Performed at [facility]    Report Status PENDING  Incomplete  Urine culture     Status: None   Collection Time: 02/21/15  1:20 PM  Result Value Ref Range Status   Specimen Description URINE, CLEAN CATCH  Final   Special Requests NONE  Final   Culture   Final    8,000 COLONIES/mL INSIGNIFICANT GROWTH Performed at [facility]    Report Status 02/22/2015 FINAL  Final     Labs: CBC:  Recent Labs Lab 02/21/15 0402  WBC 7.6  NEUTROABS 5.7  HGB 13.5  HCT 41.5  MCV 94.5  PLT 257   Basic Metabolic Panel:  Recent Labs Lab 02/21/15 0402  NA 138  K 3.8  CL 101  CO2 27  GLUCOSE 130*  BUN 9  CREATININE 0.91  CALCIUM 9.0   Liver Function Tests:  Recent Labs Lab 02/21/15 0402  AST 21  ALT 24  ALKPHOS 63  BILITOT 0.4  PROT 6.5  ALBUMIN 3.8   Time spent: 20 minutes  Signed:  Jeffrey Graefe  [facility] 02/22/2015, 8:01 AM

## 2015-02-26 LAB — CULTURE, BLOOD (ROUTINE X 2)
Culture: NO GROWTH
Culture: NO GROWTH
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
## 2016-01-20 ENCOUNTER — Encounter (HOSPITAL_COMMUNITY): Payer: Self-pay

## 2016-01-20 ENCOUNTER — Emergency Department (HOSPITAL_COMMUNITY)
Admission: EM | Admit: 2016-01-20 | Discharge: 2016-01-20 | Disposition: A | Discharge status: Home / Self Care | Payer: Self-pay | Attending: Emergency Medicine | Admitting: Emergency Medicine

## 2016-01-20 DIAGNOSIS — Z5321 Procedure and treatment not carried out due to patient leaving prior to being seen by health care provider: Secondary | ICD-10-CM | POA: Insufficient documentation

## 2016-01-20 DIAGNOSIS — Z4800 Encounter for change or removal of nonsurgical wound dressing: Secondary | ICD-10-CM | POA: Insufficient documentation

## 2016-01-20 DIAGNOSIS — F17210 Nicotine dependence, cigarettes, uncomplicated: Secondary | ICD-10-CM | POA: Insufficient documentation

## 2016-01-20 NOTE — ED Triage Notes (Signed)
Pt complaining of multiple lesions to wrist and fingers. Pt states draining wounds. Pt states "dug infection out." Pt with several open wounds to L arm and hand. Pt with no obviously infected wounds. Pt denies any injury/trauma to hands. Pt afebrile at triage..Marland Kitchen

## 2016-01-20 NOTE — ED Notes (Signed)
Unable to locate pt  

## 2016-01-20 NOTE — ED Notes (Signed)
Unable to locate pt x2

## 2016-01-25 ENCOUNTER — Encounter (HOSPITAL_COMMUNITY): Payer: Self-pay

## 2016-01-25 DIAGNOSIS — F17210 Nicotine dependence, cigarettes, uncomplicated: Secondary | ICD-10-CM | POA: Insufficient documentation

## 2016-01-25 DIAGNOSIS — L02414 Cutaneous abscess of left upper limb: Secondary | ICD-10-CM | POA: Insufficient documentation

## 2016-01-25 NOTE — ED Triage Notes (Signed)
Patient w/multiple complaints.  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  Patient also has a large abscess on left elbow.  Patient also c/o dental pain.

## 2016-01-26 ENCOUNTER — Emergency Department (HOSPITAL_COMMUNITY)
Admission: EM | Admit: 2016-01-26 | Discharge: 2016-01-26 | Disposition: A | Discharge status: Home / Self Care | Payer: Self-pay | Attending: Emergency Medicine | Admitting: Emergency Medicine

## 2016-01-26 DIAGNOSIS — L02413 Cutaneous abscess of right upper limb: Secondary | ICD-10-CM

## 2016-01-26 MED ORDER — LIDOCAINE HCL 2 % IJ SOLN
10.0000 mL | Freq: Once | INTRAMUSCULAR | Status: AC
  Administered 2016-01-26: 200 mg
  Filled 2016-01-26: qty 20

## 2016-01-26 MED ORDER — DOXYCYCLINE HYCLATE 100 MG PO TABS
100.0000 mg | ORAL_TABLET | Freq: Once | ORAL | Status: AC
  Administered 2016-01-26: 100 mg via ORAL
  Filled 2016-01-26: qty 1

## 2016-01-26 MED ORDER — DOXYCYCLINE HYCLATE 100 MG PO CAPS: 100.0000 mg | ORAL_CAPSULE | Freq: Two times a day (BID) | ORAL | 0 refills | Status: DC

## 2016-01-26 NOTE — ED Notes (Signed)
Bed: WA05 Expected date:  Expected time:  Means of arrival:  Comments: 

## 2016-01-26 NOTE — ED Provider Notes (Signed)
[facility]-EMERGENCY DEPT Provider Note   CSN: [id number] Arrival date & time: 01/25/16  2109   By signing my name below, I, Cynda Acres, attest that this documentation has been prepared under the direction and in the presence of Dione Booze, MD. Electronically Signed: Cynda Acres, Scribe. 01/26/16. 1:56 AM.  History   Chief Complaint Chief Complaint  Patient presents with  . Hand Pain    HPI Comments: Dean Mcdonald is a 31 y.o. male who presents to the Emergency Department complaining of constant hand pain that began a few days ago. Patient has multiple open lesions on his bilateral hands and fingers in which the patient states is MRSA. Patient has associated large abscess of the left forearm and dental pain. He describes the severity of his pain as an 8/10. He states he dug out the infection on his hands and poured peroxide on it. He denies any other symptoms.    The history is provided by the patient. No language interpreter was used.    Past Medical History:  Diagnosis Date  . Active smoker   . Herpes genitalis in men   . No pertinent past medical history   . Substance abuse     Patient Active Problem List   Diagnosis Date Noted  . Viral meningitis 02/21/2015  . Meningitis 02/21/2015  . Cephalalgia   . Appendicitis 03/02/2011    Past Surgical History:  Procedure Laterality Date  . APPENDECTOMY  03/01/11  . LAPAROSCOPIC APPENDECTOMY  03/01/2011   Procedure: APPENDECTOMY LAPAROSCOPIC;  Surgeon: Liz Malady, MD;  Location: [facility] OR;  Service: General;  Laterality: N/A;  . NO PAST SURGERIES         Home Medications    Prior to Admission medications   Medication Sig Start Date End Date Taking? Authorizing Provider  Pseudoephedrine-APAP-DM (DAYQUIL MULTI-SYMPTOM COLD/FLU PO) Take 30 mLs by mouth every 6 (six) hours as needed (cold/flu symptoms).    Historical Provider, MD    Family History Family History  Problem Relation Age of Onset  . Family history  unknown: Yes    Social History Social History  Substance Use Topics  . Smoking status: Current Every Day Smoker    Packs/day: 1.00    Years: 18.00    Types: Cigarettes  . Smokeless tobacco: Never Used  . Alcohol use Yes     Comment: socially     Allergies   Penicillins   Review of Systems Review of Systems  All other systems reviewed and are negative.    Physical Exam Updated Vital Signs BP 129/70 (BP Location: Left Arm)   Pulse 91   Temp 98.5 F (36.9 C) (Oral)   Resp 18   SpO2 100%   Physical Exam  Constitutional: He is oriented to person, place, and time. He appears well-developed and well-nourished.  HENT:  Head: Normocephalic and atraumatic.  Generally poor dentition with multiple carious teeth.   Eyes: EOM are normal. Pupils are equal, round, and reactive to light.  Neck: Normal range of motion. Neck supple. No JVD present.  Cardiovascular: Normal rate, regular rhythm and normal heart sounds.   No murmur heard. Pulmonary/Chest: Effort normal and breath sounds normal. He has no wheezes. He has no rales. He exhibits no tenderness.  Abdominal: Soft. Bowel sounds are normal. He exhibits no distension and no mass. There is no tenderness.  Musculoskeletal: Normal range of motion. He exhibits no edema.  Abscess on the left forearm, flexor surface measuring 6 by 10 cm. Multiple areas  of erythema on his hand with no edema or drainage.   Lymphadenopathy:    He has no cervical adenopathy.  Neurological: He is alert and oriented to person, place, and time. No cranial nerve deficit. He exhibits normal muscle tone. Coordination normal.  Skin: Skin is warm and dry. No rash noted.  Psychiatric: He has a normal mood and affect. His behavior is normal. Judgment and thought content normal.  Nursing note and vitals reviewed.    ED Treatments / Results  DIAGNOSTIC STUDIES: Oxygen Saturation is 100% on RA, normla by my interpretation.    COORDINATION OF CARE: 1:55 AM  Discussed treatment plan with pt at bedside and pt agreed to plan.  Procedures Procedures (including critical care time)  Medications Ordered in ED Medications - No data to display  INCISION AND DRAINAGE Performed by: ZOXWR,UEAVWGLICK,Falon Huesca Consent: Verbal consent obtained. Risks and benefits: risks, benefits and alternatives were discussed Type: abscess  Body area: Right forearm  Anesthesia: local infiltration  Incision was made with a scalpel.  Local anesthetic: lidocaine 2% without epinephrine  Anesthetic total: 3 ml  Complexity: complex Blunt dissection to break up loculations  Drainage: purulent  Drainage amount: Large   Packing material: None   Patient tolerance: Patient tolerated the procedure well with no immediate complications.      Initial Impression / Assessment and Plan / ED Course  I have reviewed the triage vital signs and the nursing notes.  Pertinent labs & imaging results that were available during my care of the patient were reviewed by me and considered in my medical decision making (see chart for details).  Large abscess of the right forearm treated with incision and drainage. Because of lesions on the hand, as decided to place him on antibiotics. Is given a prescription for doxycycline. Because of size of abscess, I'm concerned about possible recurrence and he is referred to hand surgery if it does not continue to drain adequately.  Final Clinical Impressions(s) / ED Diagnoses   Final diagnoses:  Abscess of right forearm    New Prescriptions New Prescriptions   DOXYCYCLINE (VIBRAMYCIN) 100 MG CAPSULE    Take 1 capsule (100 mg total) by mouth 2 (two) times daily.   I personally performed the services described in this documentation, which was scribed in my presence. The recorded information has been reviewed and is accurate.       Dione Boozeavid Mckaila Duffus, MD 01/26/16 [phone]

## 2016-12-09 ENCOUNTER — Other Ambulatory Visit: Payer: Self-pay

## 2016-12-09 ENCOUNTER — Emergency Department (HOSPITAL_COMMUNITY): Payer: Self-pay

## 2016-12-09 ENCOUNTER — Encounter (HOSPITAL_COMMUNITY): Payer: Self-pay

## 2016-12-09 ENCOUNTER — Emergency Department (HOSPITAL_COMMUNITY)
Admission: EM | Admit: 2016-12-09 | Discharge: 2016-12-09 | Disposition: A | Discharge status: Home / Self Care | Payer: Self-pay | Attending: Emergency Medicine | Admitting: Emergency Medicine

## 2016-12-09 DIAGNOSIS — J069 Acute upper respiratory infection, unspecified: Secondary | ICD-10-CM | POA: Insufficient documentation

## 2016-12-09 DIAGNOSIS — F17210 Nicotine dependence, cigarettes, uncomplicated: Secondary | ICD-10-CM | POA: Insufficient documentation

## 2016-12-09 DIAGNOSIS — L03116 Cellulitis of left lower limb: Secondary | ICD-10-CM | POA: Insufficient documentation

## 2016-12-09 DIAGNOSIS — B9789 Other viral agents as the cause of diseases classified elsewhere: Secondary | ICD-10-CM | POA: Insufficient documentation

## 2016-12-09 LAB — CBC WITH DIFFERENTIAL/PLATELET
Basophils Absolute: 0 10*3/uL (ref 0.0–0.1)
Basophils Relative: 0 %
EOS ABS: 0 10*3/uL (ref 0.0–0.7)
Eosinophils Relative: 0 %
HCT: 40.4 % (ref 39.0–52.0)
HEMOGLOBIN: 13.6 g/dL (ref 13.0–17.0)
LYMPHS ABS: 1.4 10*3/uL (ref 0.7–4.0)
Lymphocytes Relative: 11 %
MCH: 30.4 pg (ref 26.0–34.0)
MCHC: 33.7 g/dL (ref 30.0–36.0)
MCV: 90.2 fL (ref 78.0–100.0)
Monocytes Absolute: 0.6 10*3/uL (ref 0.1–1.0)
Monocytes Relative: 5 %
NEUTROS PCT: 84 %
Neutro Abs: 11.2 10*3/uL — ABNORMAL HIGH (ref 1.7–7.7)
Platelets: 364 10*3/uL (ref 150–400)
RBC: 4.48 MIL/uL (ref 4.22–5.81)
RDW: 13.7 % (ref 11.5–15.5)
WBC: 13.3 10*3/uL — AB (ref 4.0–10.5)

## 2016-12-09 LAB — COMPREHENSIVE METABOLIC PANEL
ALT: 29 U/L (ref 17–63)
AST: 30 U/L (ref 15–41)
Albumin: 4.1 g/dL (ref 3.5–5.0)
Alkaline Phosphatase: 95 U/L (ref 38–126)
Anion gap: 9 (ref 5–15)
BUN: 11 mg/dL (ref 6–20)
CHLORIDE: 102 mmol/L (ref 101–111)
CO2: 27 mmol/L (ref 22–32)
CREATININE: 0.77 mg/dL (ref 0.61–1.24)
Calcium: 9.5 mg/dL (ref 8.9–10.3)
Glucose, Bld: 117 mg/dL — ABNORMAL HIGH (ref 65–99)
POTASSIUM: 3.3 mmol/L — AB (ref 3.5–5.1)
SODIUM: 138 mmol/L (ref 135–145)
Total Bilirubin: 0.3 mg/dL (ref 0.3–1.2)
Total Protein: 7.9 g/dL (ref 6.5–8.1)

## 2016-12-09 LAB — I-STAT CG4 LACTIC ACID, ED: LACTIC ACID, VENOUS: 0.79 mmol/L (ref 0.5–1.9)

## 2016-12-09 MED ORDER — CLINDAMYCIN HCL 150 MG PO CAPS: 300.0000 mg | ORAL_CAPSULE | Freq: Three times a day (TID) | ORAL | 0 refills | Status: AC

## 2016-12-09 NOTE — ED Triage Notes (Signed)
Pt reports a cough x1 week. He endorses pain when coughing and green sputum. He is also complaining of L knee swelling and pain. He denies injury, but endorses drainage from his L knee. A&Ox4. Ambulatory.

## 2016-12-09 NOTE — Discharge Instructions (Signed)
Take Tylenol 1000 mg 4 times a day for 1 week. This is the maximum dose of Tylenol (acetaminophen) you can take from all sources. Please check other over-the-counter medications and prescriptions to ensure you are not taking other medications that contain acetaminophen.  You may also take ibuprofen 400 mg 6 times a day alternating with or at the same time as tylenol.  T

## 2016-12-09 NOTE — ED Notes (Signed)
Pt left leg appears red, with an abrasion that is draining serous fluid. Pt reports that pain started 3 days. Pt also reports URI productive cough yellow and brown sputum, generalized fatigue and aches.

## 2016-12-09 NOTE — ED Provider Notes (Signed)
[facility] Provider [facility]   CSN: [id number] Arrival date & time: 12/09/16  1425     History   Chief Complaint Chief Complaint  Patient presents with  . Cough  . Knee Pain    L    HPI Dean Mcdonald is a 31 y.o. male.  HPI   31 year old male presents with concern for redness over his left knee, cough and congestion.  Reports he has had cough for approximately a week.  He reports it is been productive of yellow to green sputum.  He is also had nasal congestion.  He had sore throat, however this improved yesterday.  Reports for the last 4 days he has had pain and redness to his left knee.  Reports that it had been draining some, and today he was able to squeeze out a yellow substance from around his knee.  Reports feeling generalized weakness, fatigue.  Denies nausea, vomiting, diarrhea, urinary symptoms, known fevers.   Past Medical History:  Diagnosis Date  . Active smoker   . Herpes genitalis in men   . No pertinent past medical history   . Substance abuse [facility])     Patient Active Problem List   Diagnosis Date Noted  . Viral meningitis 02/21/2015  . Meningitis 02/21/2015  . Cephalalgia   . Appendicitis 03/02/2011    Past Surgical History:  Procedure Laterality Date  . APPENDECTOMY  03/01/11  . LAPAROSCOPIC APPENDECTOMY  03/01/2011   Procedure: APPENDECTOMY LAPAROSCOPIC;  Surgeon: Liz Malady, MD;  Location: [facility] OR;  Service: General;  Laterality: N/A;  . NO PAST SURGERIES         Home Medications    Prior to Admission medications   Medication Sig Start Date End Date Taking? Authorizing Provider  acetaminophen (TYLENOL) 500 MG tablet Take 500-2,000 mg by mouth every 4 (four) hours as needed for moderate pain.   Yes [provider]  clindamycin (CLEOCIN) 150 MG capsule Take 2 capsules (300 mg total) by mouth 3 (three) times daily for 10 days. 12/09/16 12/19/16  Alvira Monday, MD    Family History Family  History  Family history unknown: Yes    Social History Social History   Tobacco Use  . Smoking status: Current Every Day Smoker    Packs/day: 1.00    Years: 18.00    Pack years: 18.00    Types: Cigarettes  . Smokeless tobacco: Never Used  Substance Use Topics  . Alcohol use: Yes    Comment: socially  . Drug use: Yes    Types: Marijuana     Allergies   Penicillins   Review of Systems Review of Systems  Constitutional: Positive for fatigue. Negative for fever.  HENT: Positive for congestion. Negative for sore throat.   Eyes: Negative for visual disturbance.  Respiratory: Positive for cough. Negative for shortness of breath.   Cardiovascular: Negative for chest pain.  Gastrointestinal: Negative for abdominal pain, nausea and vomiting.  Genitourinary: Negative for difficulty urinating and dysuria.  Musculoskeletal: Positive for arthralgias. Negative for back pain and neck stiffness.  Skin: Positive for rash.  Neurological: Negative for syncope and headaches.     Physical Exam Updated Vital Signs BP 134/83 (BP Location: Left Arm)   Pulse 95   Temp 98.5 F (36.9 C) (Oral)   Resp 18   Ht 6\' 1"  (1.854 m)   Wt 65.8 kg (145 lb)   SpO2 100%   BMI 19.13 kg/m   Physical Exam  Constitutional: He  is oriented to person, place, and time. He appears well-developed and well-nourished. No distress.  HENT:  Head: Normocephalic and atraumatic.  Eyes: Conjunctivae and EOM are normal.  Neck: Normal range of motion.  Cardiovascular: Normal rate, regular rhythm, normal heart sounds and intact distal pulses. Exam reveals no gallop and no friction rub.  No murmur heard. Pulmonary/Chest: Effort normal and breath sounds normal. No respiratory distress. He has no wheezes. He has no rales.  Abdominal: Soft. He exhibits no distension. There is no tenderness. There is no guarding.  Musculoskeletal: He exhibits no edema.  Left knee with full range of motion  Neurological: He is alert  and oriented to person, place, and time.  Skin: Skin is warm and dry. He is not diaphoretic.  .5cm abrasion to left knee with serous drainage, surrounding erythema 4cm in a circle surrounding area with erythema extending down leg inferiorly down calf to approximately 4 cm.  No fluctuance, no purulent drainage expressed.   Nursing note and vitals reviewed.    ED Treatments / Results  Labs (all labs ordered are listed, but only abnormal results are displayed) Labs Reviewed  COMPREHENSIVE METABOLIC PANEL - Abnormal; Notable for the following components:      Result Value   Potassium 3.3 (*)    Glucose, Bld 117 (*)    All other components within normal limits  CBC WITH DIFFERENTIAL/PLATELET - Abnormal; Notable for the following components:   WBC 13.3 (*)    Neutro Abs 11.2 (*)    All other components within normal limits  URINALYSIS, ROUTINE W REFLEX MICROSCOPIC  I-STAT CG4 LACTIC ACID, ED    EKG  EKG Interpretation None       Radiology Dg Chest 2 View  Result Date: 12/09/2016 CLINICAL DATA:  Productive cough EXAM: CHEST  2 VIEW COMPARISON:  None. FINDINGS: Heart and mediastinal contours are within normal limits. No focal opacities or effusions. No acute bony abnormality. IMPRESSION: No active cardiopulmonary disease. Electronically Signed   By: Charlett NoseKevin  Dover M.D.   On: 12/09/2016 16:25   Dg Knee Complete 4 Views Left  Result Date: 12/09/2016 CLINICAL DATA:  Left knee swelling, pain, draining wound anteriorly EXAM: LEFT KNEE - COMPLETE 4+ VIEW COMPARISON:  None. FINDINGS: Soft tissue swelling over the anterior inferior aspect of the left knee. No acute bony abnormality. Specifically, no fracture, subluxation, or dislocation. No joint effusion. No soft tissue gas. IMPRESSION: Soft tissue swelling over the anterior inferior left knee. No acute bony abnormality. Electronically Signed   By: Charlett NoseKevin  Dover M.D.   On: 12/09/2016 16:26    Procedures Procedures (including critical care  time)  Medications Ordered in ED Medications - No data to display   Initial Impression / Assessment and Plan / ED Course  I have reviewed the triage vital signs and the nursing notes.  Pertinent labs & imaging results that were available during my care of the patient were reviewed by me and considered in my medical decision making (see chart for details).    31 year old male with a history of smoking presents with concern for cough, congestion, and left knee erythema.  X-ray of the chest shows no sign of pneumonia x-ray of the left knee shows no sign of bony abnormality or effusion.  Suspect patient's cough, and congestion is a viral URI, and recommend supportive care.  Regarding patient's left knee erythema--he has good range of motion of his knee, no sign of effusion on x-ray, and have low suspicion for septic arthritis at  this time.  I have performed bedside ultrasound, however is unable to record images given to technical difficulty with the machine.  I observed cobblestoning without sign of large abscess.  His exam is also more consistent with cellulitis and abscess, without fluctuance noted.  He has significant induration and erythema consistent with cellulitis.  Recommend clindamycin 3 times a day for 10 days.  Discussed reasons to return to the emergency department in detail, including worsening knee pain, fevers, worsening erythema. Patient discharged in stable condition with understanding of reasons to return.   Final Clinical Impressions(s) / ED Diagnoses   Final diagnoses:  Viral URI with cough  Cellulitis of left knee    ED Discharge Orders        Ordered    clindamycin (CLEOCIN) 150 MG capsule  3 times daily     12/09/16 1810       Alvira MondaySchlossman, Maryclaire Stoecker, MD 12/09/16 2157

## 2016-12-09 NOTE — ED Notes (Signed)
Pt is aware a urine sample is needed. Urinal at bedside.  

## 2016-12-10 NOTE — Care Management Note (Signed)
Case Management Note  CM consulted for no PCP.  CM noted pt was given PCP follow up information on AVS.  No further CM needs noted at this time.

## 2017-10-27 ENCOUNTER — Emergency Department (HOSPITAL_COMMUNITY)
Admission: EM | Admit: 2017-10-27 | Discharge: 2017-10-27 | Disposition: A | Discharge status: Home / Self Care | Payer: Self-pay | Attending: Emergency Medicine | Admitting: Emergency Medicine

## 2017-10-27 ENCOUNTER — Other Ambulatory Visit: Payer: Self-pay

## 2017-10-27 ENCOUNTER — Encounter (HOSPITAL_COMMUNITY): Payer: Self-pay | Admitting: Emergency Medicine

## 2017-10-27 DIAGNOSIS — R360 Urethral discharge without blood: Secondary | ICD-10-CM | POA: Insufficient documentation

## 2017-10-27 DIAGNOSIS — R300 Dysuria: Secondary | ICD-10-CM | POA: Insufficient documentation

## 2017-10-27 DIAGNOSIS — Z202 Contact with and (suspected) exposure to infections with a predominantly sexual mode of transmission: Secondary | ICD-10-CM | POA: Insufficient documentation

## 2017-10-27 DIAGNOSIS — F17210 Nicotine dependence, cigarettes, uncomplicated: Secondary | ICD-10-CM | POA: Insufficient documentation

## 2017-10-27 LAB — URINALYSIS, ROUTINE W REFLEX MICROSCOPIC
Bilirubin Urine: NEGATIVE
GLUCOSE, UA: NEGATIVE mg/dL
KETONES UR: NEGATIVE mg/dL
Nitrite: NEGATIVE
PH: 8 (ref 5.0–8.0)
Protein, ur: NEGATIVE mg/dL
RBC / HPF: >50 RBC/hpf — ABNORMAL HIGH (ref 0–5)
Specific Gravity, Urine: 1.011 (ref 1.005–1.030)
WBC, UA: >50 WBC/hpf — ABNORMAL HIGH (ref 0–5)

## 2017-10-27 MED ORDER — CEFTRIAXONE SODIUM 250 MG IJ SOLR
250.0000 mg | Freq: Once | INTRAMUSCULAR | Status: AC
  Administered 2017-10-27: 250 mg via INTRAMUSCULAR
  Filled 2017-10-27: qty 250

## 2017-10-27 MED ORDER — LIDOCAINE HCL (PF) 1 % IJ SOLN
INTRAMUSCULAR | Status: AC
  Administered 2017-10-27: 5 mL
  Filled 2017-10-27: qty 5

## 2017-10-27 MED ORDER — DOXYCYCLINE HYCLATE 100 MG PO CAPS: 100.0000 mg | ORAL_CAPSULE | Freq: Two times a day (BID) | ORAL | 0 refills | Status: AC

## 2017-10-27 MED ORDER — AZITHROMYCIN 250 MG PO TABS
1000.0000 mg | ORAL_TABLET | Freq: Once | ORAL | Status: AC
  Administered 2017-10-27: 1000 mg via ORAL
  Filled 2017-10-27: qty 4

## 2017-10-27 NOTE — ED Provider Notes (Signed)
[facility] EMERGENCY DEPARTMENT Provider Note   CSN: [id number] Arrival date & time: 10/27/17  1901     History   Chief Complaint Chief Complaint  Patient presents with  . SEXUALLY TRANSMITTED DISEASE    HPI Dean Mcdonald is a 32 y.o. male.  32 y.o male with a PMH of STI presents to the ED with a chief complaint of penile discharge and dysuria x 1 week. Patient arrives to discharge as white, green color originating from his penis.  He now reports 3 days ago he began to feel burning when he pees reports today he noted some blood in his urine.  He does report a previous history of a sexually transmitted infection but does not remember the name and states he just had a shot in a [facility].  He has not tried any medication for relief in symptoms.  Denies any testicular pain, testicular swelling, fevers or abdominal pain.     Past Medical History:  Diagnosis Date  . Active smoker   . Herpes genitalis in men   . No pertinent past medical history   . Substance abuse [facility])     Patient Active Problem List   Diagnosis Date Noted  . Viral meningitis 02/21/2015  . Meningitis 02/21/2015  . Cephalalgia   . Appendicitis 03/02/2011    Past Surgical History:  Procedure Laterality Date  . APPENDECTOMY  03/01/11  . LAPAROSCOPIC APPENDECTOMY  03/01/2011   Procedure: APPENDECTOMY LAPAROSCOPIC;  Surgeon: Liz Malady, MD;  Location: [facility] OR;  Service: General;  Laterality: N/A;  . NO PAST SURGERIES          Home Medications    Prior to Admission medications   Medication Sig Start Date End Date Taking? Authorizing Provider  acetaminophen (TYLENOL) 500 MG tablet Take 500-2,000 mg by mouth every 4 (four) hours as needed for moderate pain.    [provider]  doxycycline (VIBRAMYCIN) 100 MG capsule Take 1 capsule (100 mg total) by mouth 2 (two) times daily for 7 days. 10/27/17 11/03/17  Claude Manges, PA-C    Family History Family  History  Family history unknown: Yes    Social History Social History   Tobacco Use  . Smoking status: Current Every Day Smoker    Packs/day: 1.00    Years: 18.00    Pack years: 18.00    Types: Cigarettes  . Smokeless tobacco: Never Used  Substance Use Topics  . Alcohol use: Yes    Comment: socially  . Drug use: Yes    Types: Marijuana     Allergies   Penicillins   Review of Systems Review of Systems  Constitutional: Negative for fever.  Genitourinary: Positive for discharge and dysuria.     Physical Exam Updated Vital Signs BP 129/82 (BP Location: Right Arm)   Pulse 72   Temp 98.6 F (37 C)   Resp 16   SpO2 100%   Physical Exam  Constitutional: He is oriented to person, place, and time. He appears well-developed and well-nourished.  HENT:  Head: Normocephalic and atraumatic.  Neck: Normal range of motion. Neck supple.  Cardiovascular: Normal heart sounds.  Pulmonary/Chest: Effort normal.  Abdominal: Soft.  Genitourinary: Right testis shows no tenderness. Right testis is descended. Left testis shows no tenderness. Left testis is descended. Circumcised. No phimosis, paraphimosis, hypospadias, penile erythema or penile tenderness. Discharge found.  Genitourinary Comments: Green discharge from penile region.There is no tenderness noted to glans, testicles, no ulcer,  vesicles or rash noted. Chaperoned by Sabino Gasser.  Neurological: He is alert and oriented to person, place, and time.  Skin: Skin is warm and dry.  Nursing note and vitals reviewed.    ED Treatments / Results  Labs (all labs ordered are listed, but only abnormal results are displayed) Labs Reviewed  URINALYSIS, ROUTINE W REFLEX MICROSCOPIC - Abnormal; Notable for the following components:      Result Value   APPearance CLOUDY (*)    Hgb urine dipstick LARGE (*)    Leukocytes, UA LARGE (*)    RBC / HPF >50 (*)    WBC, UA >50 (*)    Bacteria, UA RARE (*)    All other components within  normal limits  URINE CULTURE  GC/CHLAMYDIA PROBE AMP ([place]) NOT AT [facility]    EKG None  Radiology No results found.  Procedures Procedures (including critical care time)  Medications Ordered in ED Medications  cefTRIAXone (ROCEPHIN) injection 250 mg (250 mg Intramuscular Given 10/27/17 2052)  azithromycin (ZITHROMAX) tablet 1,000 mg (1,000 mg Oral Given 10/27/17 2052)  lidocaine (PF) (XYLOCAINE) 1 % injection (5 mLs  Given 10/27/17 2052)     Initial Impression / Assessment and Plan / ED Course  I have reviewed the triage vital signs and the nursing notes.  Pertinent labs & imaging results that were available during my care of the patient were reviewed by me and considered in my medical decision making (see chart for details).   Presents here with penile discharge and dysuria x1 week.  During examination there are no vesicles, erythema, testicular swelling, testicular tenderness.  I have collected a gonorrhea and chlamydia probe to have advised patient that I will treat him today with the Cipro and Rocephin.  And also reports the area, UA showed cloudy appearance along with large hemoglobin, large leukocytes, greater than 50 white blood cell count and rare bacteria.  At this time I have advised patient that I will also treat him with doxycycline.  Also send this urine for culture.  Patient's vitals have been stable during ED visit, patient stable for discharge.  Return precautions provided.  Final Clinical Impressions(s) / ED Diagnoses   Final diagnoses:  STD exposure    ED Discharge Orders         Ordered    doxycycline (VIBRAMYCIN) 100 MG capsule  2 times daily     10/27/17 2059           Claude Manges, PA-C 10/27/17 2111    Pricilla Loveless, MD 10/27/17 2358

## 2017-10-27 NOTE — ED Triage Notes (Signed)
Patient requesting STD screening reports penile discharge with dysuria onset last week , denies fever , pt. stated symptoms similar to his STD in then past .

## 2017-10-27 NOTE — Discharge Instructions (Addendum)
Today you were treated for gonorrhea and chlamydia, please refrain from sexual intercourse for the next 10 days.I have also prescribed antibiotics please take 1 tablet twice a day for the next 7 days. If you experience any testicular pain, testicular swelling or fever return to the ED for reevaluation.

## 2017-10-28 LAB — GC/CHLAMYDIA PROBE AMP (~~LOC~~) NOT AT ARMC
Chlamydia: NEGATIVE
Neisseria Gonorrhea: POSITIVE — AB

## 2017-10-29 LAB — URINE CULTURE: Culture: NO GROWTH

## 2018-03-20 ENCOUNTER — Emergency Department (HOSPITAL_COMMUNITY)
Admission: EM | Admit: 2018-03-20 | Discharge: 2018-03-20 | Disposition: A | Discharge status: Home / Self Care | Payer: Self-pay | Attending: Emergency Medicine | Admitting: Emergency Medicine

## 2018-03-20 ENCOUNTER — Other Ambulatory Visit: Payer: Self-pay

## 2018-03-20 ENCOUNTER — Encounter (HOSPITAL_COMMUNITY): Payer: Self-pay

## 2018-03-20 DIAGNOSIS — F17210 Nicotine dependence, cigarettes, uncomplicated: Secondary | ICD-10-CM | POA: Insufficient documentation

## 2018-03-20 DIAGNOSIS — Z23 Encounter for immunization: Secondary | ICD-10-CM | POA: Insufficient documentation

## 2018-03-20 DIAGNOSIS — K047 Periapical abscess without sinus: Secondary | ICD-10-CM | POA: Insufficient documentation

## 2018-03-20 DIAGNOSIS — L02416 Cutaneous abscess of left lower limb: Secondary | ICD-10-CM | POA: Insufficient documentation

## 2018-03-20 DIAGNOSIS — L02419 Cutaneous abscess of limb, unspecified: Secondary | ICD-10-CM

## 2018-03-20 MED ORDER — LIDOCAINE-EPINEPHRINE 2 %-1:200000 IJ SOLN
10.0000 mL | Freq: Once | INTRAMUSCULAR | Status: AC
Start: 2018-03-20 — End: 2018-03-20
  Administered 2018-03-20: 10 mL
  Filled 2018-03-20: qty 20

## 2018-03-20 MED ORDER — ALUM & MAG HYDROXIDE-SIMETH 200-200-20 MG/5ML PO SUSP
15.0000 mL | Freq: Once | ORAL | Status: AC
  Administered 2018-03-20: 15 mL via ORAL
  Filled 2018-03-20: qty 30

## 2018-03-20 MED ORDER — DOXYCYCLINE HYCLATE 100 MG PO CAPS: 100.0000 mg | ORAL_CAPSULE | Freq: Two times a day (BID) | ORAL | 0 refills | Status: AC

## 2018-03-20 MED ORDER — TETANUS-DIPHTH-ACELL PERTUSSIS 5-2.5-18.5 LF-MCG/0.5 IM SUSP
0.5000 mL | Freq: Once | INTRAMUSCULAR | Status: AC
  Administered 2018-03-20: 0.5 mL via INTRAMUSCULAR
  Filled 2018-03-20: qty 0.5

## 2018-03-20 MED ORDER — IBUPROFEN 400 MG PO TABS
600.0000 mg | ORAL_TABLET | Freq: Once | ORAL | Status: AC
  Administered 2018-03-20: 600 mg via ORAL
  Filled 2018-03-20: qty 1

## 2018-03-20 MED ORDER — LIDOCAINE-EPINEPHRINE-TETRACAINE (LET) SOLUTION
3.0000 mL | Freq: Once | NASAL | Status: AC
  Administered 2018-03-20: 3 mL via TOPICAL
  Filled 2018-03-20: qty 3

## 2018-03-20 MED ORDER — CLINDAMYCIN HCL 150 MG PO CAPS
450.0000 mg | ORAL_CAPSULE | Freq: Once | ORAL | Status: AC
  Administered 2018-03-20: 450 mg via ORAL
  Filled 2018-03-20: qty 3

## 2018-03-20 MED ORDER — DOXYCYCLINE HYCLATE 100 MG PO TABS
100.0000 mg | ORAL_TABLET | Freq: Once | ORAL | Status: AC
  Administered 2018-03-20: 100 mg via ORAL
  Filled 2018-03-20: qty 1

## 2018-03-20 MED ORDER — CLINDAMYCIN HCL 150 MG PO CAPS: 450.0000 mg | ORAL_CAPSULE | Freq: Three times a day (TID) | ORAL | 0 refills | Status: AC

## 2018-03-20 MED ORDER — BUPIVACAINE-EPINEPHRINE (PF) 0.5% -1:200000 IJ SOLN
1.8000 mL | Freq: Once | INTRAMUSCULAR | Status: AC
  Administered 2018-03-20: 1.8 mL
  Filled 2018-03-20: qty 1.8

## 2018-03-20 NOTE — ED Triage Notes (Signed)
Pt arrives to [facility] via POV with abcess to back of left thigh.  States noticed it a few days ago being sore, progressively getting worse last night.  Unsure if he was bitten by insect ([employer]) or ingrown hair. Denies fevers, otherwise feels fine. Does report abcessed tooth.

## 2018-03-20 NOTE — ED Notes (Signed)
Discharge instructions and prescriptions discussed with Pt. Pt verbalized understanding. Pt stable and ambulatory.   

## 2018-03-20 NOTE — Discharge Instructions (Addendum)
Please see the information and instructions below regarding your visit.  Your diagnoses today include:  1. Dental abscess   2. Abscess of thigh     Abscesses form when an infection in your skin starts to collect bacteria and white blood cells, walling it off from the rest of your body to protect you from a bigger infection. Risk factors for this type of infection include:  ?Break in the skin ?Diabetes ?Swollen areas  Sometimes the infection starts to spread to surrounding tissue, causing redness and swelling. We call this cellulitis.   Tests performed today include: See side panel of your discharge paperwork for testing performed today. Vital signs are listed at the bottom of these instructions.   Medications prescribed:    Take any prescribed medications only as prescribed, and any over the counter medications only as directed on the packaging.  Doxycycline is an antibiotic that fights infection in the skin. This medication can make your skin sensitive to the sun, so please ensure that you wear sunscreen, hats, or other coverage over your skin while taking this.  You are prescribed clindamycin.  This combination of dual antibiotic therapy coverage at high risk for antibiotic associated diarrhea. Please take all of your antibiotics until finished.   You may develop abdominal discomfort or nausea from the antibiotic. If this occurs, you may take it with food. Some patients also get diarrhea with antibiotics. You may help offset this with probiotics which you can buy or get in yogurt. Do not eat or take the probiotics until 2 hours after your antibiotic.    Some people develop allergies to antibiotics. Symptoms of antibiotic allergy can be mild and include a flat rash and itching. They can also be more serious and include:  ?Hives - Hives are raised, red patches of skin that are usually very itchy.  ?Lip or tongue swelling  ?Trouble swallowing or breathing  ?Blistering of the skin or  mouth.  If you have any of these serious symptoms, please seek emergency medical care immediately.     Home care instructions:  Please follow any educational materials contained in this packet.   Some things that may promote healing of your wound and infection include:  Raise your arm or leg to reduce swelling - Raise the arm or leg up above the level of your heart 3 or 4 times a day, for 30 minutes each time. Keep the infected area clean and dry. You can take a shower or bath, but be sure to pat the area dry with a towel afterward. Do not put any antibiotic ointments or creams on the area. Reapply a dry gauze dressing any time the bandage has become soaked with drainage, or after cleansing the wound.  Apply warm compresses to the wound 3-4 times daily to encourage drainage.   Return instructions:  Please return to the Emergency Department if you experience worsening symptoms. You should return for reevaluation of your infection if you notice spreading redness, increased swelling, an abscess develops, or you develop signs and symptoms of a systemic illness such as fever and chills.  Please return to the emergency department if the abscess in your mouth becomes larger, you have difficulty breathing, difficulty swallowing, swelling of your tongue or face. Please return if you have any other emergent concerns.  Additional Information:   Your vital signs today were: BP 130/90    Pulse 86    Temp 98 F (36.7 C) (Oral)    Resp 16  Ht 6\' 1"  (1.854 m)    Wt 70.3 kg    SpO2 98%    BMI 20.45 kg/m  If your blood pressure (BP) was elevated on multiple readings during this visit above 130 for the top number or above 80 for the bottom number, please have this repeated by your primary care provider within one month. --------------  Thank you for allowing [place] to participate in your care today.

## 2018-03-20 NOTE — ED Provider Notes (Signed)
[facility] EMERGENCY DEPARTMENT Provider Note   CSN: [id number] Arrival date & time: 03/20/18  1256    History   Chief Complaint No chief complaint on file.   HPI Dean Mcdonald is a 33 y.o. male.     HPI  Patient is a 33 year old male with past medical history of substance use, denies IVDU presenting for tooth abscess and abscess of the left medial thigh.  He reports that they both occurred over the past 3 days.  He reports that he has had poor dentition for a long time, however he does not have a history of getting dental abscesses.  He denies any difficulty breathing, difficulty swallowing, swelling of the tongue, submandibular region or neck.  He denies any fever or chills.  Patient reports that he does not know if he got bit by a bug at work as he works outside or if he had any kind of puncture wound.  He reports that it is been draining slightly in his left medial thigh, however it is mostly red.  Has not been performing warm compresses.  Unknown last tetanus shot.  Past Medical History:  Diagnosis Date  . Active smoker   . Herpes genitalis in men   . No pertinent past medical history   . Substance abuse [facility])     Patient Active Problem List   Diagnosis Date Noted  . Viral meningitis 02/21/2015  . Meningitis 02/21/2015  . Cephalalgia   . Appendicitis 03/02/2011    Past Surgical History:  Procedure Laterality Date  . APPENDECTOMY  03/01/11  . LAPAROSCOPIC APPENDECTOMY  03/01/2011   Procedure: APPENDECTOMY LAPAROSCOPIC;  Surgeon: Liz Malady, MD;  Location: [facility] OR;  Service: General;  Laterality: N/A;  . NO PAST SURGERIES          Home Medications    Prior to Admission medications   Medication Sig Start Date End Date Taking? Authorizing Provider  acetaminophen (TYLENOL) 500 MG tablet Take 500-2,000 mg by mouth every 4 (four) hours as needed for moderate pain.    [provider]    Family History Family History  Family  history unknown: Yes    Social History Social History   Tobacco Use  . Smoking status: Current Every Day Smoker    Packs/day: 1.00    Years: 18.00    Pack years: 18.00    Types: Cigarettes  . Smokeless tobacco: Never Used  Substance Use Topics  . Alcohol use: Yes    Comment: socially  . Drug use: Yes    Types: Marijuana     Allergies   Penicillins   Review of Systems Review of Systems  Constitutional: Negative for chills and fever.  HENT: Positive for dental problem. Negative for trouble swallowing and voice change.   Respiratory: Negative for stridor.   Gastrointestinal: Negative for abdominal pain, nausea and vomiting.  Skin: Positive for color change and wound.  Allergic/Immunologic: Negative for immunocompromised state.  Neurological: Negative for weakness and numbness.  All other systems reviewed and are negative.    Physical Exam Updated Vital Signs BP 138/90 (BP Location: Right Arm)   Pulse 84   Temp 98 F (36.7 C) (Oral)   Resp 12   Ht 6\' 1"  (1.854 m)   Wt 70.3 kg   SpO2 100%   BMI 20.45 kg/m   Physical Exam Vitals signs and nursing note reviewed.  Constitutional:      General: He is not in acute distress.  Appearance: He is well-developed.  HENT:     Head: Normocephalic and atraumatic.     Mouth/Throat:      Comments: Dental cavities and poor oral dentition noted. Pain along tooth as depicted in image.  There is an associated supraperiosteal abscess.  Midline uvula. No trismus. OP clear and moist. No oropharyngeal erythema or edema. Neck supple with no tenderness. No facial edema.  Eyes:     Conjunctiva/sclera: Conjunctivae normal.     Pupils: Pupils are equal, round, and reactive to light.  Neck:     Musculoskeletal: Normal range of motion and neck supple.  Cardiovascular:     Rate and Rhythm: Normal rate and regular rhythm.     Heart sounds: S1 normal and S2 normal. No murmur.  Pulmonary:     Effort: Pulmonary effort is normal.      Breath sounds: Normal breath sounds. No wheezing or rales.  Abdominal:     General: There is no distension.     Palpations: Abdomen is soft.     Tenderness: There is no abdominal tenderness. There is no guarding.  Musculoskeletal: Normal range of motion.        General: No deformity.  Lymphadenopathy:     Cervical: No cervical adenopathy.  Skin:    General: Skin is warm and dry.     Findings: Erythema and lesion present. No rash.     Comments: Patient has a 3 cm x 4 cm area of induration with overlying fluctuance and erythema of the left lateral thigh.  Small amount of purulent drainage overlying.  Neurological:     Mental Status: He is alert.     Comments: Cranial nerves grossly intact. Patient moves extremities symmetrically and with good coordination.  Psychiatric:        Behavior: Behavior normal.        Thought Content: Thought content normal.        Judgment: Judgment normal.      ED Treatments / Results  Labs (all labs ordered are listed, but only abnormal results are displayed) Labs Reviewed - No data to display  EKG None  Radiology No results found.  Procedures .Marland KitchenIncision and Drainage Date/Time: 03/20/2018 5:19 PM Performed by: Elisha Ponder, PA-C Authorized by: Elisha Ponder, PA-C   Consent:    Consent obtained:  Verbal   Consent given by:  Patient   Risks discussed:  Incomplete drainage, bleeding and pain   Alternatives discussed:  No treatment Location:    Type:  Abscess   Location:  Lower extremity   Lower extremity location:  Leg   Leg location:  L upper leg Pre-procedure details:    Skin preparation:  Betadine Anesthesia (see MAR for exact dosages):    Anesthesia method:  Local infiltration   Local anesthetic:  Lidocaine 2% WITH epi Procedure type:    Complexity:  Simple Procedure details:    Incision types:  Stab incision   Incision depth:  Dermal   Scalpel blade:  11   Wound management:  Probed and deloculated   Drainage:  Purulent  and bloody   Drainage amount:  Moderate   Packing materials:  1/4 in gauze Post-procedure details:    Patient tolerance of procedure:  Tolerated well, no immediate complications .Marland KitchenIncision and Drainage Date/Time: 03/20/2018 5:21 PM Performed by: Elisha Ponder, PA-C Authorized by: Elisha Ponder, PA-C   Consent:    Consent obtained:  Verbal   Consent given by:  Patient   Risks discussed:  Bleeding, incomplete drainage and pain   Alternatives discussed:  No treatment Location:    Type:  Abscess   Location:  Mouth (Dental)   Mouth location:  Alveolar process Anesthesia (see MAR for exact dosages):    Anesthesia method:  Local infiltration   Local anesthetic:  Bupivacaine 0.5% WITH epi Procedure type:    Complexity:  Simple Procedure details:    Incision types:  Stab incision   Scalpel blade:  11   Wound management:  Probed and deloculated   Drainage:  Purulent and bloody   Drainage amount:  Scant   Wound treatment:  Wound left open   Packing materials:  None Post-procedure details:    Patient tolerance of procedure:  Tolerated well, no immediate complications   (including critical care time)  EMERGENCY DEPARTMENT US SOFT TISSUE INTERPRETATION "Study: Limited Soft Tissue Ultrasound"  INDICATIONS: Soft tissue infection Multiple views of the body part were obtained in real-time with a multi-frequency linear probe PERFORMED BY:  Myself IMAGES ARCHIVED?: Yes SIDE:Left BODY PART:Lower extremity FINDINGS: Abcess present and Cellulitis present INTERPRETATION:  Abcess present and Cellulitis present   CPT: Neck 97948-01  Upper extremity 76880-26  Axilla 65537-48  Chest wall 27078-67  Beast 54492-01  Upper back 00712-19  Lower back 75883-25  Abdominal wall 49826-41  Pelvic wall 58309-40  Lower extremity 76808-81  Other soft tissue 10315-94   Medications Ordered in ED Medications  bupivacaine-epinephrine (MARCAINE W/ EPI) 0.5% -1:200000 injection 1.8  mL (has no administration in time range)  Tdap (BOOSTRIX) injection 0.5 mL (0.5 mLs Intramuscular Given 03/20/18 1439)  lidocaine-EPINEPHrine-tetracaine (LET) solution (3 mLs Topical Given 03/20/18 1440)     Initial Impression / Assessment and Plan / ED Course  I have reviewed the triage vital signs and the nursing notes.  Pertinent labs & imaging results that were available during my care of the patient were reviewed by me and considered in my medical decision making (see chart for details).        Patient is nontoxic-appearing, afebrile, and in no acute distress.  No signs of SIRS or sepsis today.  Patient with dental abscess and concomitant abscess of left lateral thigh.  Denies IVDU.  Tdap updated today.  Both were amenable to I&D today.  Given patient's high risk history for MRSA, and incomplete coverage of clindamycin for MRSA in this community, will add on doxycycline.  I did counsel the patient that with the addition of 2 antibiotics to cover both dental and skin abscess, he is at high risk of antibiotic associated diarrhea.  I encouraged yogurt use as well as probiotic use.  He was instructed to return in 48 hours for wound check and packing removal.  He was counseled on applying warm compresses and performing sitz baths.  Or compressions given for any fevers, chills, erythema streaking up the leg, or any new or worsening symptoms.  Patient is in understanding and agrees with the plan of care.  Final Clinical Impressions(s) / ED Diagnoses   Final diagnoses:  Dental abscess  Abscess of thigh    ED Discharge Orders         Ordered    clindamycin (CLEOCIN) 150 MG capsule  3 times daily     03/20/18 1725    doxycycline (VIBRAMYCIN) 100 MG capsule  2 times daily     03/20/18 1725           Delia Chimes 03/20/18 1728    Loren Racer, MD 03/21/18 [phone]

## 2018-09-06 ENCOUNTER — Emergency Department (HOSPITAL_COMMUNITY): Payer: Medicaid Other

## 2018-09-06 ENCOUNTER — Emergency Department (HOSPITAL_COMMUNITY)
Admission: EM | Admit: 2018-09-06 | Discharge: 2018-09-06 | Disposition: A | Discharge status: Home / Self Care | Payer: Medicaid Other | Attending: Emergency Medicine | Admitting: Emergency Medicine

## 2018-09-06 DIAGNOSIS — Z20828 Contact with and (suspected) exposure to other viral communicable diseases: Secondary | ICD-10-CM | POA: Diagnosis not present

## 2018-09-06 DIAGNOSIS — R05 Cough: Secondary | ICD-10-CM | POA: Insufficient documentation

## 2018-09-06 DIAGNOSIS — R509 Fever, unspecified: Secondary | ICD-10-CM

## 2018-09-06 DIAGNOSIS — F17210 Nicotine dependence, cigarettes, uncomplicated: Secondary | ICD-10-CM | POA: Diagnosis not present

## 2018-09-06 DIAGNOSIS — R059 Cough, unspecified: Secondary | ICD-10-CM

## 2018-09-06 LAB — RAPID URINE DRUG SCREEN, HOSP PERFORMED
Amphetamines: POSITIVE — AB
Barbiturates: NOT DETECTED
Benzodiazepines: NOT DETECTED
Cocaine: NOT DETECTED
Opiates: NOT DETECTED
Tetrahydrocannabinol: NOT DETECTED

## 2018-09-06 LAB — CBC WITH DIFFERENTIAL/PLATELET
Abs Immature Granulocytes: 0.07 10*3/uL (ref 0.00–0.07)
Basophils Absolute: 0 10*3/uL (ref 0.0–0.1)
Basophils Relative: 0 %
Eosinophils Absolute: 0.1 10*3/uL (ref 0.0–0.5)
Eosinophils Relative: 1 %
HCT: 44.6 % (ref 39.0–52.0)
Hemoglobin: 14.8 g/dL (ref 13.0–17.0)
Immature Granulocytes: 1 %
Lymphocytes Relative: 12 %
Lymphs Abs: 1.7 10*3/uL (ref 0.7–4.0)
MCH: 30.6 pg (ref 26.0–34.0)
MCHC: 33.2 g/dL (ref 30.0–36.0)
MCV: 92.1 fL (ref 80.0–100.0)
Monocytes Absolute: 0.9 10*3/uL (ref 0.1–1.0)
Monocytes Relative: 6 %
Neutro Abs: 11.4 10*3/uL — ABNORMAL HIGH (ref 1.7–7.7)
Neutrophils Relative %: 80 %
Platelets: 263 10*3/uL (ref 150–400)
RBC: 4.84 MIL/uL (ref 4.22–5.81)
RDW: 12.6 % (ref 11.5–15.5)
WBC: 14.2 10*3/uL — ABNORMAL HIGH (ref 4.0–10.5)
nRBC: 0 % (ref 0.0–0.2)

## 2018-09-06 LAB — URINALYSIS, ROUTINE W REFLEX MICROSCOPIC
Bilirubin Urine: NEGATIVE
Glucose, UA: NEGATIVE mg/dL
Hgb urine dipstick: NEGATIVE
Ketones, ur: NEGATIVE mg/dL
Leukocytes,Ua: NEGATIVE
Nitrite: NEGATIVE
Protein, ur: NEGATIVE mg/dL
Specific Gravity, Urine: 1.021 (ref 1.005–1.030)
pH: 7 (ref 5.0–8.0)

## 2018-09-06 LAB — COMPREHENSIVE METABOLIC PANEL
ALT: 23 U/L (ref 0–44)
AST: 30 U/L (ref 15–41)
Albumin: 3.7 g/dL (ref 3.5–5.0)
Alkaline Phosphatase: 101 U/L (ref 38–126)
Anion gap: 11 (ref 5–15)
BUN: 7 mg/dL (ref 6–20)
CO2: 25 mmol/L (ref 22–32)
Calcium: 9.2 mg/dL (ref 8.9–10.3)
Chloride: 102 mmol/L (ref 98–111)
Creatinine, Ser: 0.79 mg/dL (ref 0.61–1.24)
GFR calc Af Amer: >60 mL/min (ref >60)
GFR calc non Af Amer: >60 mL/min (ref >60)
Glucose, Bld: 108 mg/dL — ABNORMAL HIGH (ref 70–99)
Potassium: 3.9 mmol/L (ref 3.5–5.1)
Sodium: 138 mmol/L (ref 135–145)
Total Bilirubin: 0.6 mg/dL (ref 0.3–1.2)
Total Protein: 8.1 g/dL (ref 6.5–8.1)

## 2018-09-06 LAB — ETHANOL: Alcohol, Ethyl (B): <10 mg/dL (ref <10)

## 2018-09-06 LAB — LACTIC ACID, PLASMA
Lactic Acid, Venous: 2.4 mmol/L (ref 0.5–1.9)
Lactic Acid, Venous: 1.4 mmol/L (ref 0.5–1.9)

## 2018-09-06 LAB — SARS CORONAVIRUS 2 BY RT PCR (HOSPITAL ORDER, PERFORMED IN ~~LOC~~ HOSPITAL LAB): SARS Coronavirus 2: NEGATIVE

## 2018-09-06 MED ORDER — SODIUM CHLORIDE 0.9 % IV BOLUS
1000.0000 mL | Freq: Once | INTRAVENOUS | Status: AC
  Administered 2018-09-06: 1000 mL via INTRAVENOUS

## 2018-09-06 MED ORDER — AZITHROMYCIN 250 MG PO TABS: 250.0000 mg | ORAL_TABLET | Freq: Every day | ORAL | 0 refills | Status: DC

## 2018-09-06 NOTE — ED Triage Notes (Signed)
Pt to ED via EMS found walking down street. Pt c/o cough, fever, SHOB  X  1.5 weeks. Reports members in household are sick.

## 2018-09-06 NOTE — ED Notes (Signed)
Date and time results received: 09/06/18    Test: Lactic Acid Critical Value: 2.4  Name of Provider Notified: Francia Greaves

## 2018-09-06 NOTE — Discharge Instructions (Signed)
Please return for any problem.   Follow up with your regular care providers as instructed.  

## 2018-09-06 NOTE — ED Provider Notes (Signed)
[place] DEPT Provider Note   CSN: [id number] Arrival date & time: 09/06/18  1243     History   Chief Complaint Chief Complaint  Patient presents with  . Cough    HPI Dean Mcdonald is a 33 y.o. male.     33 year old male with prior medical history as detailed below presents for evaluation of fever and cough.  Patient reports onset of symptoms approximately 1 week ago.  Patient reports that his mother has similar symptoms.  He denies known exposure to COVID positive patients.  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  He typically shoots up (IVDA).  He denies nausea or vomiting.  He is reporting a cough that is mildly productive.  He reports feeling mildly short of breath with exertion.  He is not take anything for his symptoms today.  He is not currently on antibiotics.  He has not seen any other medical providers.    The history is provided by the patient and medical records.  Cough Cough characteristics:  Productive Sputum characteristics:  Nondescript Severity:  Moderate Onset quality:  Gradual Duration:  1 week Timing:  Constant Progression:  Worsening Chronicity:  New Relieved by:  Nothing Worsened by:  Nothing   Past Medical History:  Diagnosis Date  . Active smoker   . Herpes genitalis in men   . No pertinent past medical history   . Substance abuse [facility])     Patient Active Problem List   Diagnosis Date Noted  . Viral meningitis 02/21/2015  . Meningitis 02/21/2015  . Cephalalgia   . Appendicitis 03/02/2011    Past Surgical History:  Procedure Laterality Date  . APPENDECTOMY  03/01/11  . LAPAROSCOPIC APPENDECTOMY  03/01/2011   Procedure: APPENDECTOMY LAPAROSCOPIC;  Surgeon: Zenovia Jarred, MD;  Location: [facility];  Service: General;  Laterality: N/A;  . NO PAST SURGERIES          Home Medications    Prior to Admission medications   Medication Sig Start Date End Date  Taking? Authorizing Provider  acetaminophen (TYLENOL) 500 MG tablet Take 500-2,000 mg by mouth every 4 (four) hours as needed for moderate pain.    [provider]    Family History Family History  Family history unknown: Yes    Social History Social History   Tobacco Use  . Smoking status: Current Every Day Smoker    Packs/day: 1.00    Years: 18.00    Pack years: 18.00    Types: Cigarettes  . Smokeless tobacco: Never Used  Substance Use Topics  . Alcohol use: Yes    Comment: socially  . Drug use: Yes    Types: Marijuana     Allergies   Penicillins   Review of Systems Review of Systems  Respiratory: Positive for cough.   All other systems reviewed and are negative.    Physical Exam Updated Vital Signs SpO2 100%   Physical Exam Vitals signs and nursing note reviewed.  Constitutional:      General: He is not in acute distress.    Appearance: He is well-developed.  HENT:     Head: Normocephalic and atraumatic.  Eyes:     Conjunctiva/sclera: Conjunctivae normal.     Pupils: Pupils are equal, round, and reactive to light.  Neck:     Musculoskeletal: Normal range of motion and neck supple.  Cardiovascular:     Rate and Rhythm: Normal rate and regular  rhythm.     Heart sounds: Normal heart sounds.  Pulmonary:     Effort: Pulmonary effort is normal. No respiratory distress.     Breath sounds: Normal breath sounds.  Abdominal:     General: There is no distension.     Palpations: Abdomen is soft.     Tenderness: There is no abdominal tenderness.  Musculoskeletal: Normal range of motion.        General: No deformity.  Skin:    General: Skin is warm and dry.  Neurological:     Mental Status: He is alert and oriented to person, place, and time.      ED Treatments / Results  Labs (all labs ordered are listed, but only abnormal results are displayed) Labs Reviewed  CBC WITH DIFFERENTIAL/PLATELET - Abnormal; Notable for the following components:       Result Value   WBC 14.2 (*)    Neutro Abs 11.4 (*)    All other components within normal limits  COMPREHENSIVE METABOLIC PANEL - Abnormal; Notable for the following components:   Glucose, Bld 108 (*)    All other components within normal limits  LACTIC ACID, PLASMA - Abnormal; Notable for the following components:   Lactic Acid, Venous 2.4 (*)    All other components within normal limits  SARS CORONAVIRUS 2 (HOSPITAL ORDER, PERFORMED IN [facility])  CULTURE, BLOOD (ROUTINE X 2)  CULTURE, BLOOD (ROUTINE X 2)  ETHANOL  URINALYSIS, ROUTINE W REFLEX MICROSCOPIC  RAPID URINE DRUG SCREEN, HOSP PERFORMED  LACTIC ACID, PLASMA    EKG None  Radiology Dg Chest Port 1 View  Result Date: 09/06/2018 CLINICAL DATA:  Fever and shortness of breath EXAM: PORTABLE CHEST 1 VIEW COMPARISON:  12/09/2016 FINDINGS: Cardiac shadows within normal limits. The lungs are clear bilaterally. Bilateral nipple piercings are seen. No bony abnormality is noted. IMPRESSION: No acute abnormality noted. Electronically Signed   By: Alcide Clever M.D.   On: 09/06/2018 15:16    Procedures Procedures (including critical care time)  Medications Ordered in ED Medications  sodium chloride 0.9 % bolus 1,000 mL (has no administration in time range)     Initial Impression / Assessment and Plan / ED Course  I have reviewed the triage vital signs and the nursing notes.  Pertinent labs & imaging results that were available during my care of the patient were reviewed by me and considered in my medical decision making (see chart for details).        MDM  Screen complete  [name] was evaluated in Emergency Department on 09/06/2018 for the symptoms described in the history of present illness. He was evaluated in the context of the global COVID-19 pandemic, which necessitated consideration that the patient might be at risk for infection with the SARS-CoV-2 virus that causes COVID-19. Institutional  protocols and algorithms that pertain to the evaluation of patients at risk for COVID-19 are in a state of rapid change based on information released by regulatory bodies including the CDC and federal and state organizations. These policies and algorithms were followed during the patient's care in the ED.  Patient is presenting for evaluation of fever, cough, and mild shortness of breath.  Patient's presentation is consistent with likely viral infection.  Of note, patient's COVID test is negative for today.  Chest x-ray does not show pneumonia.  Labs otherwise are without significant abnormality.  Patient does feel improved following ED evaluation.  He desires discharge home.  Importance of close  follow-up is stressed.  Strict return precautions given and understood.  Final Clinical Impressions(s) / ED Diagnoses   Final diagnoses:  Cough  Fever, unspecified fever cause    ED Discharge Orders         Ordered    azithromycin (ZITHROMAX) 250 MG tablet  Daily     09/06/18 1525           Wynetta FinesMessick, Shonte Beutler C, MD 09/06/18 1526

## 2018-09-11 LAB — CULTURE, BLOOD (ROUTINE X 2)
Culture: NO GROWTH
Culture: NO GROWTH
Special Requests: ADEQUATE
Special Requests: ADEQUATE

## 2019-05-24 ENCOUNTER — Ambulatory Visit (HOSPITAL_COMMUNITY)
Admission: EM | Admit: 2019-05-24 | Discharge: 2019-05-24 | Disposition: A | Discharge status: Home / Self Care | Payer: Medicaid Other | Attending: Physician Assistant | Admitting: Physician Assistant

## 2019-05-24 ENCOUNTER — Encounter (HOSPITAL_COMMUNITY): Payer: Self-pay

## 2019-05-24 ENCOUNTER — Other Ambulatory Visit: Payer: Self-pay

## 2019-05-24 DIAGNOSIS — L03113 Cellulitis of right upper limb: Secondary | ICD-10-CM | POA: Diagnosis not present

## 2019-05-24 DIAGNOSIS — T148XXA Other injury of unspecified body region, initial encounter: Secondary | ICD-10-CM

## 2019-05-24 MED ORDER — CLINDAMYCIN HCL 150 MG PO CAPS: 150.0000 mg | ORAL_CAPSULE | Freq: Three times a day (TID) | ORAL | 0 refills | Status: AC

## 2019-05-24 NOTE — ED Notes (Signed)
Wound cleansed, dsg applied.

## 2019-05-24 NOTE — ED Triage Notes (Signed)
C/o right hand pain between thumb and pointer finger. Reports there was a blister, and states pain is getting worse.

## 2019-05-24 NOTE — Discharge Instructions (Addendum)
Take the clindamycin 3 times a day for 7 days Keep the wound clean and bandaged Return if swelling, discharge or severely worsening pain  Take ibuprofen and tylenol for pain

## 2019-05-24 NOTE — ED Provider Notes (Signed)
[facility]    CSN: [id number] Arrival date & time: 05/24/19  1332      History   Chief Complaint Chief Complaint  Patient presents with  . Hand Pain  . Blister    HPI Dean Mcdonald is a 34 y.o. male.   Patient presents for evaluation of wound from burst blister and hand pain.  Reports several days to a week ago he was working on the tool caused a blister the area between his right thumb and right index finger.  He reports his blister burst after a few days.  Part of it is been very painful over the area of the wound and the tissue is between his thumb and first finger.  There is concern as he had relatives over that were recently diagnosed with MRSA and is worried about having MRSA infection.  He also reports on his right pointer finger there is a small pustule.  He denies any drainage from the big wound from the burst blister.  Denies any fevers or chills.  Denies pain in the thumb joint.   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     Past Medical History:  Diagnosis Date  . Active smoker   . Herpes genitalis in men   . No pertinent past medical history   . Substance abuse [facility])     Patient Active Problem List   Diagnosis Date Noted  . Viral meningitis 02/21/2015  . Meningitis 02/21/2015  . Cephalalgia   . Appendicitis 03/02/2011    Past Surgical History:  Procedure Laterality Date  . APPENDECTOMY  03/01/11  . LAPAROSCOPIC APPENDECTOMY  03/01/2011   Procedure: APPENDECTOMY LAPAROSCOPIC;  Surgeon: Liz Malady, MD;  Location: [facility] OR;  Service: General;  Laterality: N/A;  . NO PAST SURGERIES         Home Medications    Prior to Admission medications   Medication Sig Start Date End Date Taking? Authorizing Provider  azithromycin (ZITHROMAX) 250 MG tablet Take 1 tablet (250 mg total) by mouth daily. Take first 2 tablets together, then 1 every day until finished. 09/06/18   Wynetta Fines, MD  clindamycin (CLEOCIN) 150 MG capsule Take 1 capsule  (150 mg total) by mouth 3 (three) times daily for 7 days. 05/24/19 05/31/19  Evelin Cake, Veryl Speak, PA-C  Phenyleph-Doxylamine-DM-APAP (NYQUIL SEVERE COLD/FLU) 5-6.25-10-325 MG/15ML LIQD Take 15 mLs by mouth as needed (congestion).    [provider]    Family History Family History  Family history unknown: Yes    Social History Social History   Tobacco Use  . Smoking status: Current Every Day Smoker    Packs/day: 0.30    Years: 18.00    Pack years: 5.40    Types: Cigarettes  . Smokeless tobacco: Never Used  Substance Use Topics  . Alcohol use: Yes    Comment: socially  . Drug use: Yes    Types: Marijuana     Allergies   Penicillins   Review of Systems Review of Systems   Physical Exam Triage Vital Signs ED Triage Vitals  Enc Vitals Group     BP 05/24/19 1442 131/78     Pulse Rate 05/24/19 1442 85     Resp 05/24/19 1442 17     Temp 05/24/19 1442 98 F (36.7 C)     Temp Source 05/24/19 1442 Oral     SpO2 05/24/19 1442 97 %     Weight --      Height --  Head Circumference --      Peak Flow --      Pain Score 05/24/19 1440 6     Pain Loc --      Pain Edu? --      Excl. in [place]? --    No data found.  Updated Vital Signs BP 131/78 (BP Location: Left Arm)   Pulse 85   Temp 98 F (36.7 C) (Oral)   Resp 17   SpO2 97%   Visual Acuity Right Eye Distance:   Left Eye Distance:   Bilateral Distance:    Right Eye Near:   Left Eye Near:    Bilateral Near:     Physical Exam Vitals and nursing note reviewed.  Constitutional:      Appearance: He is well-developed.  HENT:     Head: Normocephalic and atraumatic.  Cardiovascular:     Rate and Rhythm: Normal rate.     Heart sounds: No murmur.  Pulmonary:     Effort: Pulmonary effort is normal. No respiratory distress.  Musculoskeletal:       Hands:     Cervical back: Neck supple.     Comments: Area marked by resents open wound.  There is granulation tissue.  Very tender to palpation in the area  surrounding.  There is a small wound with pustule on the right finger.  This drained on removing patient's ring.   Hands are dirty with grease and oil  Skin:    General: Skin is warm and dry.  Neurological:     Mental Status: He is alert.      UC Treatments / Results  Labs (all labs ordered are listed, but only abnormal results are displayed) Labs Reviewed - No data to display  EKG   Radiology No results found.  Procedures Procedures (including critical care time)  Medications Ordered in UC Medications - No data to display  Initial Impression / Assessment and Plan / UC Course  I have reviewed the triage vital signs and the nursing notes.  Pertinent labs & imaging results that were available during my care of the patient were reviewed by me and considered in my medical decision making (see chart for details).     #Blister wound #Cellulitis in the hand Patient is 34 year old presenting with wound with concern of developing cellulitis.  Given worsening pain in surrounding area of the wound and adjacent infection on the right pointer finger and known exposures in the household to MRSA she feels.  To treat today.  Placed on clindamycin and given wound care instructions.  Return and follow-up precautions were discussed.  Patient verbalized understanding Final Clinical Impressions(s) / UC Diagnoses   Final diagnoses:  Blister  Cellulitis of right upper extremity     Discharge Instructions     Take the clindamycin 3 times a day for 7 days Keep the wound clean and bandaged Return if swelling, discharge or severely worsening pain  Take ibuprofen and tylenol for pain      ED Prescriptions    Medication Sig Dispense Auth. Provider   clindamycin (CLEOCIN) 150 MG capsule Take 1 capsule (150 mg total) by mouth 3 (three) times daily for 7 days. 21 capsule Brigette Hopfer, Marguerita Beards, PA-C     PDMP not reviewed this encounter.   Purnell Shoemaker, PA-C 05/25/19 0010

## 2019-07-06 DIAGNOSIS — Z419 Encounter for procedure for purposes other than remedying health state, unspecified: Secondary | ICD-10-CM | POA: Diagnosis not present

## 2019-08-06 DIAGNOSIS — Z419 Encounter for procedure for purposes other than remedying health state, unspecified: Secondary | ICD-10-CM | POA: Diagnosis not present

## 2019-09-06 DIAGNOSIS — Z419 Encounter for procedure for purposes other than remedying health state, unspecified: Secondary | ICD-10-CM | POA: Diagnosis not present

## 2019-10-06 DIAGNOSIS — Z419 Encounter for procedure for purposes other than remedying health state, unspecified: Secondary | ICD-10-CM | POA: Diagnosis not present

## 2019-11-06 DIAGNOSIS — Z419 Encounter for procedure for purposes other than remedying health state, unspecified: Secondary | ICD-10-CM | POA: Diagnosis not present

## 2019-12-06 DIAGNOSIS — Z419 Encounter for procedure for purposes other than remedying health state, unspecified: Secondary | ICD-10-CM | POA: Diagnosis not present

## 2020-01-06 DIAGNOSIS — Z419 Encounter for procedure for purposes other than remedying health state, unspecified: Secondary | ICD-10-CM | POA: Diagnosis not present

## 2020-02-06 ENCOUNTER — Encounter (HOSPITAL_COMMUNITY): Payer: Self-pay | Admitting: Emergency Medicine

## 2020-02-06 ENCOUNTER — Emergency Department (HOSPITAL_COMMUNITY)
Admission: EM | Admit: 2020-02-06 | Discharge: 2020-02-07 | Disposition: A | Discharge status: Home / Self Care | Payer: Medicaid Other | Attending: Emergency Medicine | Admitting: Emergency Medicine

## 2020-02-06 DIAGNOSIS — T148XXA Other injury of unspecified body region, initial encounter: Secondary | ICD-10-CM

## 2020-02-06 DIAGNOSIS — Z419 Encounter for procedure for purposes other than remedying health state, unspecified: Secondary | ICD-10-CM | POA: Diagnosis not present

## 2020-02-06 DIAGNOSIS — S39011A Strain of muscle, fascia and tendon of abdomen, initial encounter: Secondary | ICD-10-CM | POA: Diagnosis not present

## 2020-02-06 DIAGNOSIS — R109 Unspecified abdominal pain: Secondary | ICD-10-CM | POA: Insufficient documentation

## 2020-02-06 DIAGNOSIS — F17210 Nicotine dependence, cigarettes, uncomplicated: Secondary | ICD-10-CM | POA: Insufficient documentation

## 2020-02-06 LAB — URINALYSIS, ROUTINE W REFLEX MICROSCOPIC
Bilirubin Urine: NEGATIVE
Glucose, UA: NEGATIVE mg/dL
Hgb urine dipstick: NEGATIVE
Ketones, ur: NEGATIVE mg/dL
Leukocytes,Ua: NEGATIVE
Nitrite: NEGATIVE
Protein, ur: NEGATIVE mg/dL
Specific Gravity, Urine: 1.025 (ref 1.005–1.030)
pH: 6.5 (ref 5.0–8.0)

## 2020-02-06 LAB — BASIC METABOLIC PANEL
Anion gap: 12 (ref 5–15)
BUN: 8 mg/dL (ref 6–20)
CO2: 24 mmol/L (ref 22–32)
Calcium: 9.2 mg/dL (ref 8.9–10.3)
Chloride: 103 mmol/L (ref 98–111)
Creatinine, Ser: 1.03 mg/dL (ref 0.61–1.24)
GFR, Estimated: >60 mL/min (ref >60)
Glucose, Bld: 87 mg/dL (ref 70–99)
Potassium: 3.7 mmol/L (ref 3.5–5.1)
Sodium: 139 mmol/L (ref 135–145)

## 2020-02-06 LAB — CBC
HCT: 46.4 % (ref 39.0–52.0)
Hemoglobin: 15.6 g/dL (ref 13.0–17.0)
MCH: 29.7 pg (ref 26.0–34.0)
MCHC: 33.6 g/dL (ref 30.0–36.0)
MCV: 88.2 fL (ref 80.0–100.0)
Platelets: 271 10*3/uL (ref 150–400)
RBC: 5.26 MIL/uL (ref 4.22–5.81)
RDW: 13.6 % (ref 11.5–15.5)
WBC: 8.5 10*3/uL (ref 4.0–10.5)
nRBC: 0 % (ref 0.0–0.2)

## 2020-02-06 NOTE — ED Triage Notes (Signed)
Pt reports bilateral flank pain x2 days with darker urine since yesterday, denies any other symptoms. No hx of renal issues.

## 2020-02-07 ENCOUNTER — Encounter (HOSPITAL_COMMUNITY): Payer: Self-pay | Admitting: Emergency Medicine

## 2020-02-07 MED ORDER — LIDOCAINE 5 % EX PTCH: 1.0000 | MEDICATED_PATCH | CUTANEOUS | 0 refills | Status: DC

## 2020-02-07 MED ORDER — LIDOCAINE 5 % EX PTCH
2.0000 | MEDICATED_PATCH | CUTANEOUS | Status: DC
  Filled 2020-02-07: qty 2

## 2020-02-07 MED ORDER — NAPROXEN 250 MG PO TABS
500.0000 mg | ORAL_TABLET | Freq: Once | ORAL | Status: AC
  Administered 2020-02-07: 500 mg via ORAL
  Filled 2020-02-07: qty 2

## 2020-02-07 NOTE — ED Provider Notes (Signed)
[facility] EMERGENCY DEPARTMENT Provider Note   CSN: [id number] Arrival date & time: 02/06/20  1041     History Chief Complaint  Patient presents with  . Flank Pain    Dean Mcdonald is a 35 y.o. male.  The history is provided by the patient.  Flank Pain This is a recurrent problem. The current episode started more than 1 week ago. The problem occurs constantly. The problem has been gradually worsening. Pertinent negatives include no chest pain, no abdominal pain, no headaches and no shortness of breath. Nothing aggravates the symptoms. Nothing relieves the symptoms. He has tried nothing for the symptoms. The treatment provided no relief.  Patient with substance abuse recently released from prison presents with bilateral "flank pain".  Pain is actually at the pelvic brim B and has been there for some time.  It is worse with working as he must lift things.  No f/c/r.  No weakness, no numbness.       Past Medical History:  Diagnosis Date  . Active smoker   . Herpes genitalis in men   . No pertinent past medical history   . Substance abuse [facility])     Patient Active Problem List   Diagnosis Date Noted  . Viral meningitis 02/21/2015  . Meningitis 02/21/2015  . Cephalalgia   . Appendicitis 03/02/2011    Past Surgical History:  Procedure Laterality Date  . APPENDECTOMY  03/01/11  . LAPAROSCOPIC APPENDECTOMY  03/01/2011   Procedure: APPENDECTOMY LAPAROSCOPIC;  Surgeon: Liz Malady, MD;  Location: [facility] OR;  Service: General;  Laterality: N/A;  . NO PAST SURGERIES         Family History  Family history unknown: Yes    Social History   Tobacco Use  . Smoking status: Current Every Day Smoker    Packs/day: 0.30    Years: 18.00    Pack years: 5.40    Types: Cigarettes  . Smokeless tobacco: Never Used  Substance Use Topics  . Alcohol use: Yes    Comment: socially  . Drug use: Yes    Types: Marijuana    Home Medications Prior to Admission  medications   Medication Sig Start Date End Date Taking? Authorizing Provider  azithromycin (ZITHROMAX) 250 MG tablet Take 1 tablet (250 mg total) by mouth daily. Take first 2 tablets together, then 1 every day until finished. 09/06/18   Wynetta Fines, MD  Phenyleph-Doxylamine-DM-APAP (NYQUIL SEVERE COLD/FLU) 5-6.25-10-325 MG/15ML LIQD Take 15 mLs by mouth as needed (congestion).    [provider]    Allergies    Penicillins  Review of Systems   Review of Systems  Constitutional: Negative for fever.  HENT: Negative for congestion.   Eyes: Negative for visual disturbance.  Respiratory: Negative for shortness of breath.   Cardiovascular: Negative for chest pain.  Gastrointestinal: Negative for abdominal pain and vomiting.  Genitourinary: Positive for flank pain. Negative for difficulty urinating, dysuria and hematuria.  Musculoskeletal: Negative for arthralgias.  Skin: Negative for rash.  Neurological: Negative for weakness, numbness and headaches.  Psychiatric/Behavioral: Negative for agitation.  All other systems reviewed and are negative.   Physical Exam Updated Vital Signs BP (!) 133/103   Pulse 79   Temp 98.3 F (36.8 C) (Oral)   Resp 14   SpO2 99%   Physical Exam Vitals and nursing note reviewed.  Constitutional:      General: He is not in acute distress.    Appearance: Normal appearance.  HENT:  Head: Normocephalic and atraumatic.     Nose: Nose normal.  Eyes:     Conjunctiva/sclera: Conjunctivae normal.     Pupils: Pupils are equal, round, and reactive to light.  Cardiovascular:     Rate and Rhythm: Normal rate and regular rhythm.     Pulses: Normal pulses.     Heart sounds: Normal heart sounds.  Pulmonary:     Effort: Pulmonary effort is normal.     Breath sounds: Normal breath sounds.  Abdominal:     General: Abdomen is flat. Bowel sounds are normal.     Palpations: Abdomen is soft.     Tenderness: There is no abdominal tenderness. There is  no guarding or rebound.  Musculoskeletal:        General: Normal range of motion.     Cervical back: Normal range of motion and neck supple.  Skin:    General: Skin is warm and dry.     Capillary Refill: Capillary refill takes less than 2 seconds.  Neurological:     General: No focal deficit present.     Mental Status: He is alert and oriented to person, place, and time.     Deep Tendon Reflexes: Reflexes normal.  Psychiatric:        Mood and Affect: Mood normal.        Behavior: Behavior normal.     ED Results / Procedures / Treatments   Labs (all labs ordered are listed, but only abnormal results are displayed) Results for orders placed or performed during the hospital encounter of 02/06/20  Urinalysis, Routine w reflex microscopic Urine, Clean Catch  Result Value Ref Range   Color, Urine YELLOW YELLOW   APPearance CLEAR CLEAR   Specific Gravity, Urine 1.025 1.005 - 1.030   pH 6.5 5.0 - 8.0   Glucose, UA NEGATIVE NEGATIVE mg/dL   Hgb urine dipstick NEGATIVE NEGATIVE   Bilirubin Urine NEGATIVE NEGATIVE   Ketones, ur NEGATIVE NEGATIVE mg/dL   Protein, ur NEGATIVE NEGATIVE mg/dL   Nitrite NEGATIVE NEGATIVE   Leukocytes,Ua NEGATIVE NEGATIVE  Basic metabolic panel  Result Value Ref Range   Sodium 139 135 - 145 mmol/L   Potassium 3.7 3.5 - 5.1 mmol/L   Chloride 103 98 - 111 mmol/L   CO2 24 22 - 32 mmol/L   Glucose, Bld 87 70 - 99 mg/dL   BUN 8 6 - 20 mg/dL   Creatinine, Ser 6.19 0.61 - 1.24 mg/dL   Calcium 9.2 8.9 - 50.9 mg/dL   GFR, Estimated >32 >67 mL/min   Anion gap 12 5 - 15  CBC  Result Value Ref Range   WBC 8.5 4.0 - 10.5 K/uL   RBC 5.26 4.22 - 5.81 MIL/uL   Hemoglobin 15.6 13.0 - 17.0 g/dL   HCT 12.4 58.0 - 99.8 %   MCV 88.2 80.0 - 100.0 fL   MCH 29.7 26.0 - 34.0 pg   MCHC 33.6 30.0 - 36.0 g/dL   RDW 33.8 25.0 - 53.9 %   Platelets 271 150 - 400 K/uL   nRBC 0.0 0.0 - 0.2 %   No results found.  Radiology No results found.  Procedures Procedures    Medications Ordered in ED Medications  lidocaine (LIDODERM) 5 % 2 patch (2 patches Transdermal Not Given 02/07/20 0047)  naproxen (NAPROSYN) tablet 500 mg (500 mg Oral Given 02/07/20 0048)    ED Course  I have reviewed the triage vital signs and the nursing notes.  Pertinent labs & imaging  results that were available during my care of the patient were reviewed by me and considered in my medical decision making (see chart for details).   The differential included stones, though is very unlikely with normal creatinine and B pain and chronicity.  No signs of infection.  No midline tenderness.  Pain is MSK in nature and will treat with lidoderm.     Dean Mcdonald was evaluated in Emergency Department Mcdonald 02/07/2020 for the symptoms described in the history of present illness. He was evaluated in the context of the global COVID-19 pandemic, which necessitated consideration that the patient might be at risk for infection with the SARS-CoV-2 virus that causes COVID-19. Institutional protocols and algorithms that pertain to the evaluation of patients at risk for COVID-19 are in a state of rapid change based Mcdonald information released by regulatory bodies including the CDC and federal and state organizations. These policies and algorithms were followed during the patient's care in the ED.  Final Clinical Impression(s) / ED Diagnoses Return for intractable cough, coughing up blood, fevers >100.4 unrelieved by medication, shortness of breath, intractable vomiting, chest pain, shortness of breath, weakness, numbness, changes in speech, facial asymmetry, abdominal pain, passing out, Inability to tolerate liquids or food, cough, altered mental status or any concerns. No signs of systemic illness or infection. The patient is nontoxic-appearing Mcdonald exam and vital signs are within normal limits.  I have reviewed the triage vital signs and the nursing notes. Pertinent labs & imaging results that were available during  my care of the patient were reviewed by me and considered in my medical decision making (see chart for details). After history, exam, and medical workup I feel the patient has been appropriately medically screened and is safe for discharge home. Pertinent diagnoses were discussed with the patient. Patient was given return precautions.   Onetta Spainhower, MD 02/07/20 5732

## 2020-03-05 DIAGNOSIS — Z419 Encounter for procedure for purposes other than remedying health state, unspecified: Secondary | ICD-10-CM | POA: Diagnosis not present

## 2020-04-05 DIAGNOSIS — Z419 Encounter for procedure for purposes other than remedying health state, unspecified: Secondary | ICD-10-CM | POA: Diagnosis not present

## 2020-05-05 DIAGNOSIS — Z419 Encounter for procedure for purposes other than remedying health state, unspecified: Secondary | ICD-10-CM | POA: Diagnosis not present

## 2020-06-05 DIAGNOSIS — Z419 Encounter for procedure for purposes other than remedying health state, unspecified: Secondary | ICD-10-CM | POA: Diagnosis not present

## 2020-07-05 DIAGNOSIS — Z419 Encounter for procedure for purposes other than remedying health state, unspecified: Secondary | ICD-10-CM | POA: Diagnosis not present

## 2020-08-05 DIAGNOSIS — Z419 Encounter for procedure for purposes other than remedying health state, unspecified: Secondary | ICD-10-CM | POA: Diagnosis not present

## 2020-09-05 DIAGNOSIS — Z419 Encounter for procedure for purposes other than remedying health state, unspecified: Secondary | ICD-10-CM | POA: Diagnosis not present

## 2020-10-05 DIAGNOSIS — Z419 Encounter for procedure for purposes other than remedying health state, unspecified: Secondary | ICD-10-CM | POA: Diagnosis not present

## 2020-11-05 DIAGNOSIS — Z419 Encounter for procedure for purposes other than remedying health state, unspecified: Secondary | ICD-10-CM | POA: Diagnosis not present

## 2020-12-05 DIAGNOSIS — Z419 Encounter for procedure for purposes other than remedying health state, unspecified: Secondary | ICD-10-CM | POA: Diagnosis not present

## 2021-01-05 DIAGNOSIS — Z419 Encounter for procedure for purposes other than remedying health state, unspecified: Secondary | ICD-10-CM | POA: Diagnosis not present

## 2021-02-05 DIAGNOSIS — Z419 Encounter for procedure for purposes other than remedying health state, unspecified: Secondary | ICD-10-CM | POA: Diagnosis not present

## 2021-03-05 DIAGNOSIS — Z419 Encounter for procedure for purposes other than remedying health state, unspecified: Secondary | ICD-10-CM | POA: Diagnosis not present

## 2021-04-05 DIAGNOSIS — Z419 Encounter for procedure for purposes other than remedying health state, unspecified: Secondary | ICD-10-CM | POA: Diagnosis not present

## 2021-04-08 ENCOUNTER — Other Ambulatory Visit: Payer: Self-pay

## 2021-04-08 ENCOUNTER — Emergency Department (HOSPITAL_COMMUNITY)
Admission: EM | Admit: 2021-04-08 | Discharge: 2021-04-08 | Disposition: A | Discharge status: Home / Self Care | Payer: Medicaid Other | Attending: Emergency Medicine | Admitting: Emergency Medicine

## 2021-04-08 ENCOUNTER — Encounter (HOSPITAL_COMMUNITY): Payer: Self-pay

## 2021-04-08 DIAGNOSIS — H16131 Photokeratitis, right eye: Secondary | ICD-10-CM | POA: Insufficient documentation

## 2021-04-08 DIAGNOSIS — H16133 Photokeratitis, bilateral: Secondary | ICD-10-CM | POA: Diagnosis not present

## 2021-04-08 DIAGNOSIS — H5713 Ocular pain, bilateral: Secondary | ICD-10-CM | POA: Diagnosis present

## 2021-04-08 MED ORDER — ERYTHROMYCIN 5 MG/GM OP OINT: TOPICAL_OINTMENT | OPHTHALMIC | 0 refills | Status: AC

## 2021-04-08 MED ORDER — OXYCODONE HCL 5 MG PO TABS: 5.0000 mg | ORAL_TABLET | ORAL | 0 refills | Status: DC | PRN

## 2021-04-08 MED ORDER — TETRACAINE HCL 0.5 % OP SOLN
2.0000 [drp] | Freq: Once | OPHTHALMIC | Status: AC
  Administered 2021-04-08: 2 [drp] via OPHTHALMIC
  Filled 2021-04-08: qty 4

## 2021-04-08 MED ORDER — FLUORESCEIN SODIUM 1 MG OP STRP
1.0000 | ORAL_STRIP | Freq: Once | OPHTHALMIC | Status: AC
  Administered 2021-04-08: 1 via OPHTHALMIC
  Filled 2021-04-08: qty 1

## 2021-04-08 MED ORDER — OXYCODONE HCL 5 MG PO TABS
5.0000 mg | ORAL_TABLET | Freq: Once | ORAL | Status: AC
  Administered 2021-04-08: 5 mg via ORAL
  Filled 2021-04-08: qty 1

## 2021-04-08 NOTE — ED Triage Notes (Signed)
Pt complains of bilateral eye pain, blurred vision.  States he was using UV/ozone lights and now has a lot of eye pain.  Denies vision loss.  Eyes have been watering.  Does not wear glasses/corrective lenses. ?

## 2021-04-08 NOTE — ED Provider Notes (Signed)
?[facility] ?Provider Note ? ? ?CSN: [id number] ?Arrival date & time: 04/08/21  0305 ? ?  ? ?History ? ?Chief Complaint  ?Patient presents with  ? Eye Pain  ? ? ?Dean Mcdonald is a 36 y.o. male. ? ?HPI ? ?  ? ?36 year old male presents with concern for eye pain and watering after UV light exposure. ? ?Reports that he turned on a sanitizing light last night around midnight briefly, and around 2 AM began to notice some eye pain and burning which slowly worsened t prior to his presentation.  He reports having bilateral red eyes, and tearing of eyes.  Severe pain in his eyes.  Reports associated blurred vision.  Denies fevers, chills, other concerns or exposures.  He does not wear corrective lenses or glasses. ? ?Past Medical History:  ?Diagnosis Date  ? Active smoker   ? Herpes genitalis in men   ? No pertinent past medical history   ? Substance abuse ([place])   ?  ? ?Home Medications ?Prior to Admission medications   ?Medication Sig Start Date End Date Taking? Authorizing Provider  ?erythromycin ophthalmic ointment Place a 1/2 inch ribbon of ointment into the lower eyelid. 04/08/21  Yes Gareth Morgan, MD  ?oxyCODONE (ROXICODONE) 5 MG immediate release tablet Take 1 tablet (5 mg total) by mouth every 4 (four) hours as needed for severe pain. 04/08/21  Yes Gareth Morgan, MD  ?azithromycin (ZITHROMAX) 250 MG tablet Take 1 tablet (250 mg total) by mouth daily. Take first 2 tablets together, then 1 every day until finished. 09/06/18   Valarie Merino, MD  ?lidocaine (LIDODERM) 5 % Place 1 patch onto the skin daily. Remove & Discard patch within 12 hours or as directed by MD 02/07/20   Randal Buba, April, MD  ?Phenyleph-Doxylamine-DM-APAP (NYQUIL SEVERE COLD/FLU) 5-6.25-10-325 MG/15ML LIQD Take 15 mLs by mouth as needed (congestion).    [provider]  ?   ? ?Allergies    ?Penicillins   ? ?Review of Systems   ?Review of Systems ?See above ?Physical Exam ?Updated Vital Signs ?BP  132/80   Pulse (!) 51   Temp 98.3 ?F (36.8 ?C) (Oral)   Resp 18   SpO2 99%  ?Physical Exam ?Vitals and nursing note reviewed.  ?Constitutional:   ?   General: He is not in acute distress. ?   Appearance: Normal appearance. He is not ill-appearing, toxic-appearing or diaphoretic.  ?HENT:  ?   Head: Normocephalic.  ?Eyes:  ?   General: Lids are normal. Vision grossly intact. Gaze aligned appropriately.  ?   Intraocular pressure: Right eye pressure is 17 mmHg. Left eye pressure is 18 mmHg.  ?   Extraocular Movements:  ?   Right eye: Normal extraocular motion.  ?   Left eye: Normal extraocular motion.  ?   Conjunctiva/sclera:  ?   Right eye: Right conjunctiva is injected. No exudate (tearing but no thick exudate). ?   Left eye: Left conjunctiva is injected. No exudate. ?   Pupils: Pupils are equal, round, and reactive to light. Pupils are equal.  ?   Right eye: No corneal abrasion or fluorescein uptake.  ?   Left eye: No corneal abrasion or fluorescein uptake.  ?Cardiovascular:  ?   Rate and Rhythm: Normal rate and regular rhythm.  ?   Pulses: Normal pulses.  ?Pulmonary:  ?   Effort: Pulmonary effort is normal. No respiratory distress.  ?Musculoskeletal:     ?   General:  No deformity or signs of injury.  ?   Cervical back: No rigidity.  ?Skin: ?   General: Skin is warm and dry.  ?   Coloration: Skin is not jaundiced or pale.  ?Neurological:  ?   General: No focal deficit present.  ?   Mental Status: He is alert and oriented to person, place, and time.  ? ? ?ED Results / Procedures / Treatments   ?Labs ?(all labs ordered are listed, but only abnormal results are displayed) ?Labs Reviewed - No data to display ? ?EKG ?None ? ?Radiology ?No results found. ? ?Procedures ?Procedures  ? ? ?Medications Ordered in ED ?Medications  ?tetracaine (PONTOCAINE) 0.5 % ophthalmic solution 2 drop (2 drops Both Eyes Given by Other 04/08/21 0916)  ?fluorescein ophthalmic strip 1 strip (1 strip Both Eyes Given 04/08/21 0916)  ?oxyCODONE  (Oxy IR/ROXICODONE) immediate release tablet 5 mg (5 mg Oral Given 04/08/21 0954)  ?  Visual Acuity ? ?Right Eye Distance: 10/20 ?Left Eye Distance: 10/12.5 ?Bilateral Distance: 10/20 ? ?Right Eye Near:   ?Left Eye Near:    ?Bilateral Near:     ? ?ED Course/ Medical Decision Making/ A&P ?  ?                        ?Medical Decision Making ?Risk ?Prescription drug management. ? ? ? ?36 year old male presents with concern for eye pain and watering after UV light exposure. ? ?DDx for eye pain includes corneal abrasion, endopthalmitis, glaucoma, keratitis, conjunctivitis.  ? ?Exam shows significant bilateral conjunctival irritation, normal intraocular pressures, no significant abrasions, and normal visual acuity. ? ?Findings and history of UV light exposure consistent with photokeratitis.  Gave prescription for erythromycin ointment, discussed pain control using primarily ibuprofen and Tylenol, gave short prescription for oxycodone for breakthrough pain given severe nature of this painful injury.  Reviewed in [place] drug database and discussed risks prior to prescription.  Gave follow-up information for ophthalmology if he does not have improvement in the next 48 hours. ? ? ? ? ? ? ? ? ?Final Clinical Impression(s) / ED Diagnoses ?Final diagnoses:  ?Photokeratitis of both eyes  ? ? ?Rx / DC Orders ?ED Discharge Orders   ? ?      Ordered  ?  erythromycin ophthalmic ointment       ? 04/08/21 [id number]  ?  oxyCODONE (ROXICODONE) 5 MG immediate release tablet  Every 4 hours PRN       ? 04/08/21 [id number]  ? ?  ?  ? ?  ? ? ?  ?Gareth Morgan, MD ?04/08/21 2052 ? ?

## 2021-04-08 NOTE — Discharge Instructions (Addendum)
You may take tylenol 1000mg  every 6 hours (for up to one week.) This is the maximum dose of acetaminophen you can take from all sources. Please check other over-the-counter medications and prescriptions to ensure you are not taking other medications that contain acetaminophen.  You may also take ibuprofen 400 mg 6 times a day alternating with or at the same time as tylenol.  (Or 600mg  4 times a day)Take oxycodone as needed for breakthrough pain.  This medication can be addicting, sedating and cause constipation.   ?

## 2021-04-08 NOTE — ED Provider Triage Note (Signed)
Emergency Medicine Provider Triage Evaluation Note ? ?Dean Mcdonald , a 36 y.o. male  was evaluated in triage.  Pt complains of bilateral eye pain, blurred vision.  States he was using UV/ozone lights and now has a lot of eye pain.  Denies vision loss.  Eyes have been watering.  Does not wear glasses/corrective lenses. ? ?Review of Systems  ?Positive: Eye pain ?Negative: fever ? ?Physical Exam  ?BP 136/82 (BP Location: Right Arm)   Pulse 82   Temp 98.3 ?F (36.8 ?C) (Oral)   Resp 18   SpO2 97%  ? ?Gen:   Awake, no distress   ?Resp:  Normal effort  ?MSK:   Moves extremities without difficulty  ?Other:  Conjunctival are injected and eyes are tearing bilaterally ? ?Medical Decision Making  ?Medically screening exam initiated at 3:27 AM.  Appropriate orders placed.  DOUG BUCKLIN was informed that the remainder of the evaluation will be completed by another provider, this initial triage assessment does not replace that evaluation, and the importance of remaining in the ED until their evaluation is complete. ? ?Eye pain after using UV/ozone light.  Will need formal eye exam. ?  ?Garlon Hatchet, PA-C ?04/08/21 7169 ? ?

## 2021-05-05 DIAGNOSIS — Z419 Encounter for procedure for purposes other than remedying health state, unspecified: Secondary | ICD-10-CM | POA: Diagnosis not present

## 2021-06-05 DIAGNOSIS — Z419 Encounter for procedure for purposes other than remedying health state, unspecified: Secondary | ICD-10-CM | POA: Diagnosis not present

## 2021-07-05 DIAGNOSIS — Z419 Encounter for procedure for purposes other than remedying health state, unspecified: Secondary | ICD-10-CM | POA: Diagnosis not present

## 2021-08-05 DIAGNOSIS — Z419 Encounter for procedure for purposes other than remedying health state, unspecified: Secondary | ICD-10-CM | POA: Diagnosis not present

## 2021-08-12 DIAGNOSIS — H5213 Myopia, bilateral: Secondary | ICD-10-CM | POA: Diagnosis not present

## 2021-08-28 ENCOUNTER — Other Ambulatory Visit: Payer: Self-pay

## 2021-08-28 ENCOUNTER — Encounter (HOSPITAL_COMMUNITY): Payer: Self-pay | Admitting: Emergency Medicine

## 2021-08-28 ENCOUNTER — Emergency Department (HOSPITAL_COMMUNITY)
Admission: EM | Admit: 2021-08-28 | Discharge: 2021-08-29 | Disposition: A | Discharge status: Home / Self Care | Payer: Medicaid Other | Attending: Emergency Medicine | Admitting: Emergency Medicine

## 2021-08-28 DIAGNOSIS — Z20822 Contact with and (suspected) exposure to covid-19: Secondary | ICD-10-CM | POA: Insufficient documentation

## 2021-08-28 DIAGNOSIS — M549 Dorsalgia, unspecified: Secondary | ICD-10-CM | POA: Diagnosis not present

## 2021-08-28 DIAGNOSIS — L03115 Cellulitis of right lower limb: Secondary | ICD-10-CM | POA: Diagnosis not present

## 2021-08-28 DIAGNOSIS — D72829 Elevated white blood cell count, unspecified: Secondary | ICD-10-CM | POA: Insufficient documentation

## 2021-08-28 DIAGNOSIS — R5383 Other fatigue: Secondary | ICD-10-CM | POA: Diagnosis present

## 2021-08-28 LAB — CBC WITH DIFFERENTIAL/PLATELET
Abs Immature Granulocytes: 0.09 10*3/uL — ABNORMAL HIGH (ref 0.00–0.07)
Basophils Absolute: 0 10*3/uL (ref 0.0–0.1)
Basophils Relative: 0 %
Eosinophils Absolute: 0.2 10*3/uL (ref 0.0–0.5)
Eosinophils Relative: 1 %
HCT: 39.6 % (ref 39.0–52.0)
Hemoglobin: 13.7 g/dL (ref 13.0–17.0)
Immature Granulocytes: 1 %
Lymphocytes Relative: 13 %
Lymphs Abs: 2.2 10*3/uL (ref 0.7–4.0)
MCH: 31.1 pg (ref 26.0–34.0)
MCHC: 34.6 g/dL (ref 30.0–36.0)
MCV: 90 fL (ref 80.0–100.0)
Monocytes Absolute: 1 10*3/uL (ref 0.1–1.0)
Monocytes Relative: 6 %
Neutro Abs: 14 10*3/uL — ABNORMAL HIGH (ref 1.7–7.7)
Neutrophils Relative %: 79 %
Platelets: 239 10*3/uL (ref 150–400)
RBC: 4.4 MIL/uL (ref 4.22–5.81)
RDW: 12.3 % (ref 11.5–15.5)
WBC: 17.5 10*3/uL — ABNORMAL HIGH (ref 4.0–10.5)
nRBC: 0 % (ref 0.0–0.2)

## 2021-08-28 LAB — URINALYSIS, ROUTINE W REFLEX MICROSCOPIC
Bacteria, UA: NONE SEEN
Bilirubin Urine: NEGATIVE
Glucose, UA: NEGATIVE mg/dL
Hgb urine dipstick: NEGATIVE
Ketones, ur: NEGATIVE mg/dL
Leukocytes,Ua: NEGATIVE
Nitrite: NEGATIVE
Protein, ur: 30 mg/dL — AB
Specific Gravity, Urine: 1.021 (ref 1.005–1.030)
pH: 7 (ref 5.0–8.0)

## 2021-08-28 LAB — COMPREHENSIVE METABOLIC PANEL
ALT: 14 U/L (ref 0–44)
AST: 17 U/L (ref 15–41)
Albumin: 3.6 g/dL (ref 3.5–5.0)
Alkaline Phosphatase: 59 U/L (ref 38–126)
Anion gap: 5 (ref 5–15)
BUN: 14 mg/dL (ref 6–20)
CO2: 22 mmol/L (ref 22–32)
Calcium: 8.8 mg/dL — ABNORMAL LOW (ref 8.9–10.3)
Chloride: 106 mmol/L (ref 98–111)
Creatinine, Ser: 1.11 mg/dL (ref 0.61–1.24)
GFR, Estimated: >60 mL/min (ref >60)
Glucose, Bld: 113 mg/dL — ABNORMAL HIGH (ref 70–99)
Potassium: 3.5 mmol/L (ref 3.5–5.1)
Sodium: 133 mmol/L — ABNORMAL LOW (ref 135–145)
Total Bilirubin: 0.9 mg/dL (ref 0.3–1.2)
Total Protein: 6 g/dL — ABNORMAL LOW (ref 6.5–8.1)

## 2021-08-28 LAB — LACTIC ACID, PLASMA: Lactic Acid, Venous: 0.7 mmol/L (ref 0.5–1.9)

## 2021-08-28 LAB — PROTIME-INR
INR: 1.2 (ref 0.8–1.2)
Prothrombin Time: 15.3 seconds — ABNORMAL HIGH (ref 11.4–15.2)

## 2021-08-28 LAB — APTT: aPTT: 27 seconds (ref 24–36)

## 2021-08-28 NOTE — ED Triage Notes (Signed)
Pt also endorses pain to right knee after missing IV drug injection approximately 3 days ago.

## 2021-08-28 NOTE — ED Triage Notes (Signed)
Pt reported to ED with c/o feeling nauseous, generalized fatigue and weakness since yesterday.

## 2021-08-28 NOTE — ED Provider Triage Note (Signed)
Emergency Medicine Provider Triage Evaluation Note  Dean Mcdonald , a 36 y.o. male  was evaluated in triage.  Pt complains of flulike symptoms.  Patient has a history of IVDU.  He patient states that yesterday he was walking around when he began feeling "queasy."  He took some Motrin and went to bed and when he woke up in the morning had shaking chills.  He notes a very painful red area on the back of his right calf where he was trying to inject drugs into a vein and missed.  He thinks he may have an infection there.  He denies cough, chest pain or shortness of breath.  He does have new back pain since beginning to feel like he had the flu 2 days ago.  Review of Systems  Positive: Flulike symptoms Negative: Fever  Physical Exam  BP 116/63   Pulse 94   Temp 98.2 F (36.8 C) (Oral)   Resp 16   SpO2 99%  Gen:   Awake, no distress   Resp:  Normal effort  MSK:   Moves extremities without difficulty  Other:  Right lower posterior calf region with erythema and tenderness, no obvious fluctuance  Medical Decision Making  Medically screening exam initiated at 10:29 PM.  Appropriate orders placed.  GRAVIEL PAYEUR was informed that the remainder of the evaluation will be completed by another provider, this initial triage assessment does not replace that evaluation, and the importance of remaining in the ED until their evaluation is complete.  Work-up initiated   Arthor Captain, PA-C 08/28/21 2230

## 2021-08-29 LAB — SARS CORONAVIRUS 2 BY RT PCR: SARS Coronavirus 2 by RT PCR: NEGATIVE

## 2021-08-29 MED ORDER — LIDOCAINE HCL (PF) 1 % IJ SOLN
INTRAMUSCULAR | Status: AC
  Filled 2021-08-29: qty 5

## 2021-08-29 MED ORDER — KETOROLAC TROMETHAMINE 60 MG/2ML IM SOLN
60.0000 mg | Freq: Once | INTRAMUSCULAR | Status: AC
  Administered 2021-08-29: 60 mg via INTRAMUSCULAR
  Filled 2021-08-29: qty 2

## 2021-08-29 MED ORDER — CEFTRIAXONE SODIUM 1 G IJ SOLR
1.0000 g | Freq: Once | INTRAMUSCULAR | Status: AC
  Administered 2021-08-29: 1 g via INTRAMUSCULAR
  Filled 2021-08-29: qty 10

## 2021-08-29 MED ORDER — DOXYCYCLINE HYCLATE 100 MG PO CAPS: 100.0000 mg | ORAL_CAPSULE | Freq: Two times a day (BID) | ORAL | 0 refills | Status: DC

## 2021-08-29 NOTE — ED Provider Notes (Signed)
[facility] EMERGENCY DEPARTMENT Provider Note   CSN: [id number] Arrival date & time: 08/28/21  2214     History  Chief Complaint  Patient presents with   Fatigue    Dean Mcdonald is a 36 y.o. male with a past medical history of IVDU who presents emergency department chief complaint of flulike symptoms.  Patient states that yesterday he began feeling "queasy and weak."  He took Motrin with relief of his symptoms was able to sleep through the night but when he awoke this morning had shaking chills.  He notes an area of pain and erythema in the right posterior calf where he tried to inject drugs into a vein but missed and popped the drugs under his skin.  He has had progressively worsening redness and pain in the area since that time.  He denies weakness, numbness.  He denies cough, shortness of breath or chest pain.  HPI     Home Medications Prior to Admission medications   Medication Sig Start Date End Date Taking? Authorizing Provider  azithromycin (ZITHROMAX) 250 MG tablet Take 1 tablet (250 mg total) by mouth daily. Take first 2 tablets together, then 1 every day until finished. 09/06/18   Wynetta Fines, MD  erythromycin ophthalmic ointment Place a 1/2 inch ribbon of ointment into the lower eyelid. 04/08/21   Alvira Monday, MD  lidocaine (LIDODERM) 5 % Place 1 patch onto the skin daily. Remove & Discard patch within 12 hours or as directed by MD 02/07/20   Nicanor Alcon, April, MD  oxyCODONE (ROXICODONE) 5 MG immediate release tablet Take 1 tablet (5 mg total) by mouth every 4 (four) hours as needed for severe pain. 04/08/21   Alvira Monday, MD  Phenyleph-Doxylamine-DM-APAP (NYQUIL SEVERE COLD/FLU) 5-6.25-10-325 MG/15ML LIQD Take 15 mLs by mouth as needed (congestion).    [provider]      Allergies    Penicillins    Review of Systems   Review of Systems  Physical Exam Updated Vital Signs BP 114/64   Pulse 69   Temp 98.2 F (36.8 C) (Oral)    Resp 15   SpO2 100%  Physical Exam   ED Results / Procedures / Treatments   Labs (all labs ordered are listed, but only abnormal results are displayed) Labs Reviewed  COMPREHENSIVE METABOLIC PANEL - Abnormal; Notable for the following components:      Result Value   Sodium 133 (*)    Glucose, Bld 113 (*)    Calcium 8.8 (*)    Total Protein 6.0 (*)    All other components within normal limits  CBC WITH DIFFERENTIAL/PLATELET - Abnormal; Notable for the following components:   WBC 17.5 (*)    Neutro Abs 14.0 (*)    Abs Immature Granulocytes 0.09 (*)    All other components within normal limits  PROTIME-INR - Abnormal; Notable for the following components:   Prothrombin Time 15.3 (*)    All other components within normal limits  URINALYSIS, ROUTINE W REFLEX MICROSCOPIC - Abnormal; Notable for the following components:   Protein, ur 30 (*)    All other components within normal limits  SARS CORONAVIRUS 2 BY RT PCR  CULTURE, BLOOD (ROUTINE X 2)  CULTURE, BLOOD (ROUTINE X 2)  LACTIC ACID, PLASMA  APTT  LACTIC ACID, PLASMA    EKG None  Radiology No results found.  Procedures Procedures    Medications Ordered in ED Medications  cefTRIAXone (ROCEPHIN) injection 1 g (1 g Intramuscular Given  08/29/21 0132)  ketorolac (TORADOL) injection 60 mg (60 mg Intramuscular Given 08/29/21 0129)  lidocaine (PF) (XYLOCAINE) 1 % injection (  Given 08/29/21 0132)    ED Course/ Medical Decision Making/ A&P                           Medical Decision Making 36 year old male who presents to the emergency department flulike symptoms.  Differential diagnosis is broad and of high complexity especially with this patient who is high risk due to history of IV drug use.  Patient has no fever or murmur on exam suggestive of endocarditis.  He has some mild back pain but is ambulatory to have low suspicion for osteomyelitis or discitis.  Patient does not appear to be septic.  He has no elevated lactic  acid.  He does have an elevated white blood cell count with an area that appears to be suspicious for cellulitis.  He does not appear to have any localized abscess low suspicion for necrotizing fasciitis or DVT as there is no circumferential redness or circumferential swelling of the leg.  Patient treated in the emergency department with Rocephin and will be discharged on doxycycline.  He was given anti-inflammatories for pain control with improvement.  I demarcated the area of erythema and he is advised to return if he has worsening in the spread of the redness or worsening in condition.  Amount and/or Complexity of Data Reviewed Labs: ordered.    Details: I ordered and reviewed labs, pertinent findings include white blood cell count of 17,000 with granulocytes suggestive of infection.  Risk Prescription drug management.      Final Clinical Impression(s) / ED Diagnoses Final diagnoses:  Cellulitis of right lower extremity    Rx / DC Orders ED Discharge Orders     None         Arthor Captain, PA-C 08/29/21 1932    Melene Plan, DO 08/30/21 [phone]

## 2021-08-29 NOTE — ED Notes (Signed)
Discharge instructions were discussed with Pt. Pt verbalized understanding and stated no questions, comments or concerns. Pt was ambulatory upon departure.

## 2021-08-29 NOTE — Discharge Instructions (Signed)
General instructions Drink enough fluid to keep your urine pale yellow. Do not touch or rub the infected area. Raise (elevate) the infected area above the level of your heart while you are sitting or lying down. Apply warm or cold compresses to the affected area as told by your health care provider. Keep all follow-up visits as told by your health care provider. This is important. These visits let your health care provider make sure a more serious infection is not developing. Contact a health care provider if: You have a fever. Your symptoms do not begin to improve within 1-2 days of starting treatment. Your bone or joint underneath the infected area becomes painful after the skin has healed. Your infection returns in the same area or another area. You notice a swollen bump in the infected area. You develop new symptoms. You have a general ill feeling (malaise) with muscle aches and pains. Get help right away if: Your symptoms get worse. You feel very sleepy. You develop vomiting or diarrhea that persists. You notice red streaks coming from the infected area. Your red area gets larger or turns dark in color. 

## 2021-09-02 LAB — CULTURE, BLOOD (ROUTINE X 2)
Culture: NO GROWTH
Culture: NO GROWTH
Special Requests: ADEQUATE
Special Requests: ADEQUATE

## 2021-09-05 DIAGNOSIS — Z419 Encounter for procedure for purposes other than remedying health state, unspecified: Secondary | ICD-10-CM | POA: Diagnosis not present

## 2021-10-05 DIAGNOSIS — Z419 Encounter for procedure for purposes other than remedying health state, unspecified: Secondary | ICD-10-CM | POA: Diagnosis not present

## 2021-11-05 DIAGNOSIS — Z419 Encounter for procedure for purposes other than remedying health state, unspecified: Secondary | ICD-10-CM | POA: Diagnosis not present

## 2021-12-05 DIAGNOSIS — Z419 Encounter for procedure for purposes other than remedying health state, unspecified: Secondary | ICD-10-CM | POA: Diagnosis not present

## 2022-01-05 DIAGNOSIS — Z419 Encounter for procedure for purposes other than remedying health state, unspecified: Secondary | ICD-10-CM | POA: Diagnosis not present

## 2022-02-05 DIAGNOSIS — Z419 Encounter for procedure for purposes other than remedying health state, unspecified: Secondary | ICD-10-CM | POA: Diagnosis not present

## 2022-03-06 DIAGNOSIS — Z419 Encounter for procedure for purposes other than remedying health state, unspecified: Secondary | ICD-10-CM | POA: Diagnosis not present

## 2022-04-06 DIAGNOSIS — Z419 Encounter for procedure for purposes other than remedying health state, unspecified: Secondary | ICD-10-CM | POA: Diagnosis not present

## 2022-05-06 DIAGNOSIS — Z419 Encounter for procedure for purposes other than remedying health state, unspecified: Secondary | ICD-10-CM | POA: Diagnosis not present

## 2022-06-06 DIAGNOSIS — Z419 Encounter for procedure for purposes other than remedying health state, unspecified: Secondary | ICD-10-CM | POA: Diagnosis not present

## 2022-07-06 DIAGNOSIS — Z419 Encounter for procedure for purposes other than remedying health state, unspecified: Secondary | ICD-10-CM | POA: Diagnosis not present

## 2022-07-27 ENCOUNTER — Encounter (HOSPITAL_COMMUNITY): Payer: Self-pay

## 2022-07-27 ENCOUNTER — Emergency Department (HOSPITAL_COMMUNITY)
Admission: EM | Admit: 2022-07-27 | Discharge: 2022-07-27 | Disposition: A | Discharge status: Home / Self Care | Payer: Medicaid Other | Source: Home / Self Care | Attending: Emergency Medicine | Admitting: Emergency Medicine

## 2022-07-27 ENCOUNTER — Other Ambulatory Visit: Payer: Self-pay

## 2022-07-27 DIAGNOSIS — Z23 Encounter for immunization: Secondary | ICD-10-CM | POA: Diagnosis not present

## 2022-07-27 DIAGNOSIS — S6991XA Unspecified injury of right wrist, hand and finger(s), initial encounter: Secondary | ICD-10-CM | POA: Diagnosis present

## 2022-07-27 DIAGNOSIS — S61011A Laceration without foreign body of right thumb without damage to nail, initial encounter: Secondary | ICD-10-CM | POA: Diagnosis not present

## 2022-07-27 DIAGNOSIS — W312XXA Contact with powered woodworking and forming machines, initial encounter: Secondary | ICD-10-CM | POA: Diagnosis not present

## 2022-07-27 MED ORDER — TETANUS-DIPHTH-ACELL PERTUSSIS 5-2.5-18.5 LF-MCG/0.5 IM SUSY
0.5000 mL | PREFILLED_SYRINGE | Freq: Once | INTRAMUSCULAR | Status: AC
  Administered 2022-07-27: 0.5 mL via INTRAMUSCULAR
  Filled 2022-07-27: qty 0.5

## 2022-07-27 NOTE — ED Provider Notes (Signed)
[place] EMERGENCY DEPARTMENT AT [facility] Provider Note   CSN: [id number] Arrival date & time: 07/27/22  0645     History  Chief Complaint  Patient presents with   Laceration    Dean Mcdonald is a 37 y.o. male.  Patient is a 37 year old male presenting today with complaints of laceration to his right thumb.  He was using his circular saw and a piece of wood kicked back causing his thumb to go into the blade.  He is able to move his thumb and states it feels the same as usual.  He is unclear when his last tetanus shot was.  No foreign bodies.  The history is provided by the patient.  Laceration      Home Medications Prior to Admission medications   Medication Sig Start Date End Date Taking? Authorizing Provider  azithromycin (ZITHROMAX) 250 MG tablet Take 1 tablet (250 mg total) by mouth daily. Take first 2 tablets together, then 1 every day until finished. 09/06/18   Wynetta Fines, MD  doxycycline (VIBRAMYCIN) 100 MG capsule Take 1 capsule (100 mg total) by mouth 2 (two) times daily. One po bid x 7 days 08/29/21   Arthor Captain, PA-C  erythromycin ophthalmic ointment Place a 1/2 inch ribbon of ointment into the lower eyelid. 04/08/21   Alvira Monday, MD  lidocaine (LIDODERM) 5 % Place 1 patch onto the skin daily. Remove & Discard patch within 12 hours or as directed by MD 02/07/20   Nicanor Alcon, April, MD  oxyCODONE (ROXICODONE) 5 MG immediate release tablet Take 1 tablet (5 mg total) by mouth every 4 (four) hours as needed for severe pain. 04/08/21   Alvira Monday, MD  Phenyleph-Doxylamine-DM-APAP (NYQUIL SEVERE COLD/FLU) 5-6.25-10-325 MG/15ML LIQD Take 15 mLs by mouth as needed (congestion).    [provider]      Allergies    Penicillins    Review of Systems   Review of Systems  Physical Exam Updated Vital Signs BP (!) 144/87   Pulse 83   Temp 98.2 F (36.8 C)   Resp 16   Ht 6\' 1"  (1.854 m)   Wt 70.3 kg   SpO2 96%   BMI 20.45 kg/m   Physical Exam Vitals and nursing note reviewed.  Constitutional:      General: He is not in acute distress. HENT:     Head: Normocephalic.  Cardiovascular:     Rate and Rhythm: Normal rate.     Pulses: Normal pulses.  Pulmonary:     Effort: Pulmonary effort is normal.  Musculoskeletal:       Hands:     Comments: Full range of motion of the right thumb at the MCP and PIP joint.  Sensation intact.  Neurological:     Mental Status: He is alert. Mental status is at baseline.  Psychiatric:        Mood and Affect: Mood normal.     ED Results / Procedures / Treatments   Labs (all labs ordered are listed, but only abnormal results are displayed) Labs Reviewed - No data to display  EKG None  Radiology No results found.  Procedures Procedures   LACERATION REPAIR Performed by: [facility] Authorized by: Gwyneth Sprout Consent: Verbal consent obtained. Risks and benefits: risks, benefits and alternatives were discussed Consent given by: patient Patient identity confirmed: provided demographic data Prepped and Draped in normal sterile fashion Wound explored  Laceration Location: right thumb  Laceration Length: 2cm  No Foreign Bodies seen  or palpated  Anesthesia: local infiltration  Local anesthetic: lidocaine 2% with epinephrine  Anesthetic total: 2 ml  Irrigation method: syringe Amount of cleaning: standard  Skin closure: 4.0 vicryl rapide  Number of sutures: 8  Technique: simple interrupted  Patient tolerance: Patient tolerated the procedure well with no immediate complications.  Medications Ordered in ED Medications  Tdap (BOOSTRIX) injection 0.5 mL (has no administration in time range)    ED Course/ Medical Decision Making/ A&P                             Medical Decision Making Risk Prescription drug management.   Patient presenting today with an uncomplicated laceration.  No evidence of tendon injury, sensation and intact.  No  findings to suggest foreign body with wound exploration.  No evidence to suggest fracture.  Wound repaired as above.  Tetanus shot updated.        Final Clinical Impression(s) / ED Diagnoses Final diagnoses:  Laceration of right thumb without foreign body without damage to nail, initial encounter    Rx / DC Orders ED Discharge Orders     None         Gwyneth Sprout, MD 07/27/22 [phone]

## 2022-07-27 NOTE — ED Triage Notes (Signed)
Lac to right thumb from skill saw.  Bleeding controlled with bandage at this time. Unknown last tetanus

## 2022-07-27 NOTE — Discharge Instructions (Signed)
Stitches should dissolve in the next 1 week.  You should be able to just pull them off.  Keep the area covered when you are working but it is okay to shower and wash and dry the area.  Do not soak underwater.

## 2022-08-06 DIAGNOSIS — Z419 Encounter for procedure for purposes other than remedying health state, unspecified: Secondary | ICD-10-CM | POA: Diagnosis not present

## 2022-09-06 DIAGNOSIS — Z419 Encounter for procedure for purposes other than remedying health state, unspecified: Secondary | ICD-10-CM | POA: Diagnosis not present

## 2022-10-06 DIAGNOSIS — Z419 Encounter for procedure for purposes other than remedying health state, unspecified: Secondary | ICD-10-CM | POA: Diagnosis not present

## 2022-11-06 DIAGNOSIS — Z419 Encounter for procedure for purposes other than remedying health state, unspecified: Secondary | ICD-10-CM | POA: Diagnosis not present

## 2022-11-26 ENCOUNTER — Ambulatory Visit: Payer: Medicaid Other | Admitting: Internal Medicine

## 2022-12-06 DIAGNOSIS — Z419 Encounter for procedure for purposes other than remedying health state, unspecified: Secondary | ICD-10-CM | POA: Diagnosis not present

## 2023-01-06 DIAGNOSIS — Z419 Encounter for procedure for purposes other than remedying health state, unspecified: Secondary | ICD-10-CM | POA: Diagnosis not present

## 2023-02-06 DIAGNOSIS — Z419 Encounter for procedure for purposes other than remedying health state, unspecified: Secondary | ICD-10-CM | POA: Diagnosis not present

## 2023-03-06 DIAGNOSIS — Z419 Encounter for procedure for purposes other than remedying health state, unspecified: Secondary | ICD-10-CM | POA: Diagnosis not present

## 2023-04-17 DIAGNOSIS — Z419 Encounter for procedure for purposes other than remedying health state, unspecified: Secondary | ICD-10-CM | POA: Diagnosis not present

## 2023-05-17 DIAGNOSIS — Z419 Encounter for procedure for purposes other than remedying health state, unspecified: Secondary | ICD-10-CM | POA: Diagnosis not present

## 2023-06-17 DIAGNOSIS — Z419 Encounter for procedure for purposes other than remedying health state, unspecified: Secondary | ICD-10-CM | POA: Diagnosis not present

## 2023-07-17 DIAGNOSIS — Z419 Encounter for procedure for purposes other than remedying health state, unspecified: Secondary | ICD-10-CM | POA: Diagnosis not present

## 2023-07-27 ENCOUNTER — Encounter (HOSPITAL_COMMUNITY): Payer: Self-pay

## 2023-07-27 ENCOUNTER — Ambulatory Visit (HOSPITAL_COMMUNITY)
Admission: RE | Admit: 2023-07-27 | Discharge: 2023-07-27 | Disposition: A | Discharge status: Home / Self Care | Source: Ambulatory Visit | Attending: Internal Medicine | Admitting: Internal Medicine

## 2023-07-27 VITALS — BP 147/97 | HR 84 | Temp 97.9°F | Resp 16

## 2023-07-27 DIAGNOSIS — Z1159 Encounter for screening for other viral diseases: Secondary | ICD-10-CM | POA: Insufficient documentation

## 2023-07-27 LAB — HEPATITIS PANEL, ACUTE
HCV Ab: REACTIVE — AB
Hep A IgM: NONREACTIVE
Hep B C IgM: NONREACTIVE
Hepatitis B Surface Ag: NONREACTIVE

## 2023-07-27 NOTE — ED Provider Notes (Signed)
 [facility]    CSN: [id number] Arrival date & time: 07/27/23  8177      History   Chief Complaint No chief complaint on file.   HPI Dean Mcdonald is a 38 y.o. male.   38 year old male who presents to urgent care requesting a referral to infectious disease.  The patient reports that he his girlfriend of 4 years was recently diagnosed with hepatitis C.  She is undergoing treatment at infectious disease.  He went there and was told that he would need a referral and was directed to urgent care for further evaluation.  The patient does not know if he actually has hepatitis C at this point or not.  He reports that he did have hepatitis C in the past but while he was incarcerated he was treated and cleared at that time.  He denies any jaundice, abdominal pain, nausea, vomiting, poor appetite.  He has been in good health recently.     Past Medical History:  Diagnosis Date   Active smoker    Herpes genitalis in men    No pertinent past medical history    Substance abuse (HCC)     Patient Active Problem List   Diagnosis Date Noted   Viral meningitis 02/21/2015   Meningitis 02/21/2015   Cephalalgia    Appendicitis 03/02/2011    Past Surgical History:  Procedure Laterality Date   APPENDECTOMY  03/01/11   LAPAROSCOPIC APPENDECTOMY  03/01/2011   Procedure: APPENDECTOMY LAPAROSCOPIC;  Surgeon: Dann FORBES Hummer, MD;  Location: [facility] OR;  Service: General;  Laterality: N/A;   NO PAST SURGERIES         Home Medications    Prior to Admission medications   Medication Sig Start Date End Date Taking? Authorizing Provider  azithromycin  (ZITHROMAX ) 250 MG tablet Take 1 tablet (250 mg total) by mouth daily. Take first 2 tablets together, then 1 every day until finished. Patient not taking: Reported on 07/27/2023 09/06/18   Laurice Maude BROCKS, MD  doxycycline  (VIBRAMYCIN ) 100 MG capsule Take 1 capsule (100 mg total) by mouth 2 (two) times daily. One po bid x 7 days Patient not  taking: Reported on 07/27/2023 08/29/21   Harris, Abigail, PA-C  erythromycin  ophthalmic ointment Place a 1/2 inch ribbon of ointment into the lower eyelid. Patient not taking: Reported on 07/27/2023 04/08/21   Dreama Longs, MD  lidocaine  (LIDODERM ) 5 % Place 1 patch onto the skin daily. Remove & Discard patch within 12 hours or as directed by MD 02/07/20   Nettie, April, MD  oxyCODONE  (ROXICODONE ) 5 MG immediate release tablet Take 1 tablet (5 mg total) by mouth every 4 (four) hours as needed for severe pain. Patient not taking: Reported on 07/27/2023 04/08/21   Dreama Longs, MD  Phenyleph-Doxylamine-DM-APAP (NYQUIL SEVERE COLD/FLU) 5-6.25-10-325 MG/15ML LIQD Take 15 mLs by mouth as needed (congestion).    [provider]    Family History Family History  Problem Relation Age of Onset   Heart failure Father     Social History Social History   Tobacco Use   Smoking status: Every Day    Current packs/day: 0.30    Average packs/day: 0.3 packs/day for 18.0 years (5.4 ttl pk-yrs)    Types: Cigarettes   Smokeless tobacco: Never  Vaping Use   Vaping status: Never Used  Substance Use Topics   Alcohol use: Not Currently    Comment: socially   Drug use: Not Currently    Types: Marijuana  Allergies   Penicillins   Review of Systems Review of Systems  Constitutional:  Negative for chills and fever.  HENT:  Negative for ear pain and sore throat.   Eyes:  Negative for pain and visual disturbance.  Respiratory:  Negative for cough and shortness of breath.   Cardiovascular:  Negative for chest pain and palpitations.  Gastrointestinal:  Negative for abdominal pain and vomiting.  Genitourinary:  Negative for dysuria and hematuria.  Musculoskeletal:  Negative for arthralgias and back pain.  Skin:  Negative for color change and rash.  Neurological:  Negative for seizures and syncope.  All other systems reviewed and are negative.    Physical Exam Triage Vital Signs ED  Triage Vitals [07/27/23 1835]  Encounter Vitals Group     BP (!) 147/97     Girls Systolic BP Percentile      Girls Diastolic BP Percentile      Boys Systolic BP Percentile      Boys Diastolic BP Percentile      Pulse Rate 84     Resp 16     Temp 97.9 F (36.6 C)     Temp Source Oral     SpO2 98 %     Weight      Height      Head Circumference      Peak Flow      Pain Score 0     Pain Loc      Pain Education      Exclude from Growth Chart    No data found.  Updated Vital Signs BP (!) 147/97 (BP Location: Left Arm)   Pulse 84   Temp 97.9 F (36.6 C) (Oral)   Resp 16   SpO2 98%   Visual Acuity Right Eye Distance:   Left Eye Distance:   Bilateral Distance:    Right Eye Near:   Left Eye Near:    Bilateral Near:     Physical Exam Vitals and nursing note reviewed.  Constitutional:      General: He is not in acute distress.    Appearance: He is well-developed.  HENT:     Head: Normocephalic and atraumatic.  Eyes:     Conjunctiva/sclera: Conjunctivae normal.  Cardiovascular:     Rate and Rhythm: Normal rate and regular rhythm.     Heart sounds: No murmur heard. Pulmonary:     Effort: Pulmonary effort is normal. No respiratory distress.     Breath sounds: Normal breath sounds.  Abdominal:     General: Bowel sounds are normal.     Palpations: Abdomen is soft.     Tenderness: There is no abdominal tenderness.  Musculoskeletal:        General: No swelling.     Cervical back: Neck supple.  Skin:    General: Skin is warm and dry.     Capillary Refill: Capillary refill takes less than 2 seconds.  Neurological:     Mental Status: He is alert.  Psychiatric:        Mood and Affect: Mood normal.      UC Treatments / Results  Labs (all labs ordered are listed, but only abnormal results are displayed) Labs Reviewed  HEPATITIS PANEL, ACUTE    EKG   Radiology No results found.  Procedures Procedures (including critical care time)  Medications Ordered  in UC Medications - No data to display  Initial Impression / Assessment and Plan / UC Course  I have reviewed the triage vital  signs and the nursing notes.  Pertinent labs & imaging results that were available during my care of the patient were reviewed by me and considered in my medical decision making (see chart for details).     Need for hepatitis C screening test   Hepatitis panel done today.  These results will take 2 to 3 days to finalize.  If there are any abnormalities we will contact you.  These results will also be available on your MyChart.  If this is positive for hepatitis C recommend contacting your primary care physician to request a referral to infectious disease for further evaluation and treatment.  May return to urgent care as needed.  Final Clinical Impressions(s) / UC Diagnoses   Final diagnoses:  Need for hepatitis C screening test     Discharge Instructions      Hepatitis panel done today.  These results will take 2 to 3 days to finalize.  If there are any abnormalities we will contact you.  These results will also be available on your MyChart.  If this is positive for hepatitis C recommend contacting your primary care physician to request a referral to infectious disease for further evaluation and treatment.  May return to urgent care as needed.    ED Prescriptions   None    PDMP not reviewed this encounter.   Teresa Almarie LABOR, [place] 07/27/23 1859

## 2023-07-27 NOTE — Discharge Instructions (Addendum)
 Hepatitis panel done today.  These results will take 2 to 3 days to finalize.  If there are any abnormalities we will contact you.  These results will also be available on your MyChart.  If this is positive for hepatitis C recommend contacting your primary care physician to request a referral to infectious disease for further evaluation and treatment.  May return to urgent care as needed.

## 2023-07-27 NOTE — ED Triage Notes (Signed)
 Patient states his girlfriend has Hep C and is currently being treated for the same. Patient states they have been together for a long and he went to the Infectious Disease and was told that he needed a referral in order to be seen.

## 2023-07-28 ENCOUNTER — Ambulatory Visit (HOSPITAL_COMMUNITY): Payer: Self-pay

## 2023-07-28 NOTE — Telephone Encounter (Signed)
 Unfortunately, because patient has had hepatitis C in the past, the antibody panel is insufficient to determine whether or not he has a second hepatitis C infection.  Patient will need to return to have HCV RNA testing ASAP.  It is my opinion that this should be performed as a nurse visit and should not require a second visit with a provider.    Please also advise patient that if his RNA testing comes back negative, RNA testing should be repeated in 4 weeks, antibody testing will not be necessary.  If RNA result is still negative at 4 weeks, he should be retested for both the antibody and RNA 4 months from now.  I am not sure if we are able to provide a referral to infectious disease here at urgent care, it would be best for him to establish care with a primary care provider for continued follow-up of his hepatitis C status.

## 2023-08-17 DIAGNOSIS — Z419 Encounter for procedure for purposes other than remedying health state, unspecified: Secondary | ICD-10-CM | POA: Diagnosis not present

## 2023-09-16 ENCOUNTER — Encounter (INDEPENDENT_AMBULATORY_CARE_PROVIDER_SITE_OTHER): Payer: Self-pay

## 2023-09-17 DIAGNOSIS — Z419 Encounter for procedure for purposes other than remedying health state, unspecified: Secondary | ICD-10-CM | POA: Diagnosis not present

## 2023-09-30 ENCOUNTER — Ambulatory Visit: Payer: Self-pay | Admitting: Family Medicine

## 2023-09-30 ENCOUNTER — Encounter: Payer: Self-pay | Admitting: Family Medicine

## 2023-09-30 VITALS — BP 114/60 | HR 101 | Temp 98.6°F | Ht 73.0 in | Wt 158.0 lb

## 2023-09-30 DIAGNOSIS — Z2821 Immunization not carried out because of patient refusal: Secondary | ICD-10-CM

## 2023-09-30 DIAGNOSIS — Z1159 Encounter for screening for other viral diseases: Secondary | ICD-10-CM | POA: Insufficient documentation

## 2023-09-30 DIAGNOSIS — L729 Follicular cyst of the skin and subcutaneous tissue, unspecified: Secondary | ICD-10-CM

## 2023-09-30 DIAGNOSIS — Z7689 Persons encountering health services in other specified circumstances: Secondary | ICD-10-CM

## 2023-09-30 DIAGNOSIS — D171 Benign lipomatous neoplasm of skin and subcutaneous tissue of trunk: Secondary | ICD-10-CM | POA: Insufficient documentation

## 2023-09-30 DIAGNOSIS — R768 Other specified abnormal immunological findings in serum: Secondary | ICD-10-CM
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
## 2023-09-30 NOTE — Assessment & Plan Note (Signed)
-   Repeat RNA test in four weeks if initial test is negative

## 2023-09-30 NOTE — Assessment & Plan Note (Signed)
 Non-painful cyst on shoulder, likely epidermal cyst. - Refer to dermatologist for evaluation and potential lancing.

## 2023-10-01 ENCOUNTER — Other Ambulatory Visit: Payer: Self-pay | Admitting: Medical Genetics

## 2023-10-01 LAB — CBC WITH DIFFERENTIAL/PLATELET
Basophils Absolute: 0 x10E3/uL (ref 0.0–0.2)
Basos: 1 %
EOS (ABSOLUTE): 0.3 x10E3/uL (ref 0.0–0.4)
Eos: 5 %
Hematocrit: 49.1 % (ref 37.5–51.0)
Hemoglobin: 16.1 g/dL (ref 13.0–17.7)
Immature Grans (Abs): 0 x10E3/uL (ref 0.0–0.1)
Immature Granulocytes: 0 %
Lymphocytes Absolute: 2 x10E3/uL (ref 0.7–3.1)
Lymphs: 34 %
MCH: 30.4 pg (ref 26.6–33.0)
MCHC: 32.8 g/dL (ref 31.5–35.7)
MCV: 93 fL (ref 79–97)
Monocytes Absolute: 0.5 x10E3/uL (ref 0.1–0.9)
Monocytes: 9 %
Neutrophils Absolute: 3.1 x10E3/uL (ref 1.4–7.0)
Neutrophils: 51 %
Platelets: 256 x10E3/uL (ref 150–450)
RBC: 5.3 x10E6/uL (ref 4.14–5.80)
RDW: 12.3 % (ref 11.6–15.4)
WBC: 5.9 x10E3/uL (ref 3.4–10.8)

## 2023-10-01 LAB — BMP8+EGFR
BUN/Creatinine Ratio: 14 (ref 9–20)
BUN: 13 mg/dL (ref 6–20)
CO2: 22 mmol/L (ref 20–29)
Calcium: 10 mg/dL (ref 8.7–10.2)
Chloride: 101 mmol/L (ref 96–106)
Creatinine, Ser: 0.95 mg/dL (ref 0.76–1.27)
Glucose: 79 mg/dL (ref 70–99)
Potassium: 4.1 mmol/L (ref 3.5–5.2)
Sodium: 138 mmol/L (ref 134–144)
eGFR: 104 mL/min/1.73 (ref >59)

## 2023-10-01 LAB — HCV RNA QUANT: Hepatitis C Quantitation: NOT DETECTED [IU]/mL

## 2023-10-14 ENCOUNTER — Ambulatory Visit: Payer: Self-pay | Admitting: Family Medicine

## 2023-10-14 NOTE — Progress Notes (Signed)
 The labs are normal,. Hep C is not detected and like we discussed in the office, it is advisable to recheck 4-6 weeks later .  Thank you!

## 2023-10-26 ENCOUNTER — Other Ambulatory Visit

## 2023-10-26 DIAGNOSIS — Z006 Encounter for examination for normal comparison and control in clinical research program: Secondary | ICD-10-CM

## 2023-11-17 ENCOUNTER — Other Ambulatory Visit: Payer: Self-pay

## 2023-11-23 LAB — GENECONNECT MOLECULAR SCREEN: Genetic Analysis Overall Interpretation: NEGATIVE

## 2023-11-30 ENCOUNTER — Emergency Department (HOSPITAL_COMMUNITY)
Admission: EM | Admit: 2023-11-30 | Discharge: 2023-11-30 | Disposition: A | Discharge status: Home / Self Care | Attending: Emergency Medicine | Admitting: Emergency Medicine

## 2023-11-30 ENCOUNTER — Encounter (HOSPITAL_COMMUNITY): Payer: Self-pay | Admitting: Emergency Medicine

## 2023-11-30 ENCOUNTER — Emergency Department (HOSPITAL_COMMUNITY)

## 2023-11-30 DIAGNOSIS — Z79899 Other long term (current) drug therapy: Secondary | ICD-10-CM | POA: Diagnosis not present

## 2023-11-30 DIAGNOSIS — S022XXA Fracture of nasal bones, initial encounter for closed fracture: Secondary | ICD-10-CM | POA: Diagnosis not present

## 2023-11-30 DIAGNOSIS — S0990XA Unspecified injury of head, initial encounter: Secondary | ICD-10-CM | POA: Insufficient documentation

## 2023-11-30 DIAGNOSIS — Y92480 Sidewalk as the place of occurrence of the external cause: Secondary | ICD-10-CM | POA: Insufficient documentation

## 2023-11-30 DIAGNOSIS — Z23 Encounter for immunization: Secondary | ICD-10-CM | POA: Insufficient documentation

## 2023-11-30 DIAGNOSIS — J342 Deviated nasal septum: Secondary | ICD-10-CM | POA: Diagnosis not present

## 2023-11-30 DIAGNOSIS — S199XXA Unspecified injury of neck, initial encounter: Secondary | ICD-10-CM | POA: Diagnosis not present

## 2023-11-30 DIAGNOSIS — S0993XA Unspecified injury of face, initial encounter: Secondary | ICD-10-CM | POA: Diagnosis not present

## 2023-11-30 DIAGNOSIS — W01198A Fall on same level from slipping, tripping and stumbling with subsequent striking against other object, initial encounter: Secondary | ICD-10-CM | POA: Insufficient documentation

## 2023-11-30 DIAGNOSIS — S0992XA Unspecified injury of nose, initial encounter: Secondary | ICD-10-CM | POA: Diagnosis present

## 2023-11-30 MED ORDER — TETANUS-DIPHTH-ACELL PERTUSSIS 5-2-15.5 LF-MCG/0.5 IM SUSP
0.5000 mL | Freq: Once | INTRAMUSCULAR | Status: AC
Start: 2023-11-30 — End: 2023-11-30
  Administered 2023-11-30: 0.5 mL via INTRAMUSCULAR
  Filled 2023-11-30: qty 0.5
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
## 2023-11-30 NOTE — Discharge Instructions (Signed)
 You were seen in the ER today for concerns of head injury. Your CT imaging was thankfully normal with exception of one small fracture at the base of your nose. There is nothing to do to repair this type of fracture and it will heal well on its own, but take Tylenol  and ibuprofen  for pain. You can apply ice over the area for the next 1-2 days to help with swelling and bruising. For any concerns of new or worsening symptoms, return to the ER.

## 2023-12-09 ENCOUNTER — Ambulatory Visit: Payer: Self-pay | Admitting: Family Medicine

## 2023-12-09 ENCOUNTER — Encounter: Payer: Self-pay | Admitting: Family Medicine

## 2023-12-09 VITALS — BP 120/70 | HR 92 | Temp 97.9°F | Wt 162.0 lb

## 2023-12-09 DIAGNOSIS — Z114 Encounter for screening for human immunodeficiency virus [HIV]: Secondary | ICD-10-CM | POA: Diagnosis not present

## 2023-12-09 DIAGNOSIS — Z0000 Encounter for general adult medical examination without abnormal findings: Secondary | ICD-10-CM

## 2023-12-09 DIAGNOSIS — Z1159 Encounter for screening for other viral diseases: Secondary | ICD-10-CM | POA: Diagnosis not present

## 2023-12-09 DIAGNOSIS — Z136 Encounter for screening for cardiovascular disorders: Secondary | ICD-10-CM

## 2023-12-09 DIAGNOSIS — R7689 Other specified abnormal immunological findings in serum: Secondary | ICD-10-CM

## 2023-12-09 NOTE — Progress Notes (Signed)
 I,Jameka J Llittleton, CMA,acting as a neurosurgeon for [employer], Dean Mcdonald.,have documented all relevant documentation on the behalf of Dean Creighton, Dean Mcdonald,as directed by  Dean Creighton, Dean Mcdonald while in the presence of Dean Creighton, Dean Mcdonald.  Subjective:   Patient ID: Dean Mcdonald , male    DOB: 1985/06/18 , 38 y.o.   MRN: [id number]  Chief Complaint  Patient presents with   Annual Exam    Patient presents today for a physical. Patient reports he has been having headaches in the mornings for the past month.     HPI Discussed the use of AI scribe software for clinical note transcription with the patient, who gave verbal consent to proceed.  History of Present Illness     Dean Mcdonald is a 38 year old male who presents for his annual physical examination.  He has a history of hepatitis C and has previously undergone treatment for the condition. He has undergone two tests, both of which were negative, and is here for a third test. He missed a previous appointment for the second test, which was supposed to be conducted six weeks after the first.  His girlfriend recently completed treatment for hepatitis C, and he wants to ensure he does not have an active infection that could be transmitted between him.  He has a history of herpes since his twenties but states it does not flare up and he does not take medication for it. No current symptoms or concerns related to sexually transmitted diseases.  No discomfort, stomach pain, or irregular bowel movements. He reports regular eating habits, consuming two to three meals a day. He also mentions consuming a significant amount of caffeine , having had three cups of coffee before the visit, which he attributes to his increased heart rate.  He has undergone genetic testing through a program offered by a hospital, which showed no genetic markers for diseases. He shares details about his ancestry, noting a predominantly European background.  He does not take any  multivitamins but has considered it after learning about more absorbable forms. He has no significant family history of diabetes, though his paternal grandfather had the condition. His brother has issues with low blood sugar occasionally. He lives with someone who has HIV and gets tested annually for it.      Past Medical History:  Diagnosis Date   Active smoker    Herpes genitalis in men    History of hepatitis C    No pertinent past medical history    Substance abuse (HCC)      Family History  Problem Relation Age of Onset   Melanoma Mother    Heart failure Father    Cataracts Father    COPD Father      Current Outpatient Medications:    erythromycin  ophthalmic ointment, Place a 1/2 inch ribbon of ointment into the lower eyelid. (Patient not taking: Reported on 07/27/2023), Disp: 3.5 g, Rfl: 0   Allergies  Allergen Reactions   Penicillins Hives and Itching    All over the body.3 Has patient had a PCN reaction causing immediate rash, facial/tongue/throat swelling, SOB or lightheadedness with hypotension:  Has patient had a PCN reaction causing severe rash involving mucus membranes or skin necrosis: Has patient had a PCN reaction that required hospitalization  Has patient had a PCN reaction occurring within the last 10 years If all of the above answers are NO, then may proceed with Cephalosporin use.       Flowsheet [employer]  Visit from 12/09/2023 in [facility]  PHQ-2 Total Score 0   Social History   Substance and Sexual Activity  Alcohol Use Not Currently   Comment: socially   Social History   Tobacco Use  Smoking Status Every Day   Current packs/day: 0.30   Average packs/day: 0.3 packs/day for 18.0 years (5.4 ttl pk-yrs)   Types: Cigarettes  Smokeless Tobacco Never  .   Review of Systems  Constitutional: Negative.   HENT: Negative.    Eyes: Negative.   Respiratory: Negative.    Cardiovascular: Negative.    Gastrointestinal: Negative.   Endocrine: Negative.   Genitourinary: Negative.   Musculoskeletal: Negative.   Skin: Negative.        Lipoma left side of back  Neurological: Negative.   Hematological: Negative.   Psychiatric/Behavioral: Negative.       Today's Vitals   12/09/23 1432  BP: 120/70  Pulse: 92  Temp: 97.9 F (36.6 C)  TempSrc: Oral  Weight: 162 lb (73.5 kg)  PainSc: 0-No pain   Body mass index is 21.37 kg/m.  Wt Readings from Last 3 Encounters:  12/09/23 162 lb (73.5 kg)  09/30/23 158 lb (71.7 kg)  07/27/22 155 lb (70.3 kg)    Objective:  Physical Exam Constitutional:      Appearance: Normal appearance.  HENT:     Head: Normocephalic.  Cardiovascular:     Rate and Rhythm: Normal rate and regular rhythm.     Pulses: Normal pulses.     Heart sounds: Normal heart sounds.  Pulmonary:     Effort: Pulmonary effort is normal.     Breath sounds: Normal breath sounds.  Abdominal:     General: Bowel sounds are normal.  Musculoskeletal:        General: Normal range of motion.  Skin:    General: Skin is warm and dry.  Neurological:     General: No focal deficit present.     Mental Status: He is alert and oriented to person, place, and time. Mental status is at baseline.  Psychiatric:        Mood and Affect: Mood normal.         Assessment And Plan:    Encounter for general adult medical examination w/o abnormal findings -     CBC -     CMP14+EGFR  Hepatitis C antibody positive -     HCV RNA quant  Screening for cardiovascular condition -     Lipid panel  Need for hepatitis B screening test -     Hepatitis B surface antibody,qualitative  Encounter for screening for HIV -     HIV Antibody (routine testing w rflx)     Return for 1 year physical. Patient was given opportunity to ask questions. Patient verbalized understanding of the plan and was able to repeat key elements of the plan. All questions were answered to their satisfaction.  I,  Dean Creighton, Dean Mcdonald, have reviewed all documentation for this visit. The documentation on 12/09/2023 for the exam, diagnosis, procedures, and orders are all accurate and complete.

## 2023-12-09 NOTE — Patient Instructions (Signed)
 Health Maintenance, Male  Adopting a healthy lifestyle and getting preventive care are important in promoting health and wellness. Ask your health care provider about:  The right schedule for you to have regular tests and exams.  Things you can do on your own to prevent diseases and keep yourself healthy.  What should I know about diet, weight, and exercise?  Eat a healthy diet    Eat a diet that includes plenty of vegetables, fruits, low-fat dairy products, and lean protein.  Do not eat a lot of foods that are high in solid fats, added sugars, or sodium.  Maintain a healthy weight  Body mass index (BMI) is a measurement that can be used to identify possible weight problems. It estimates body fat based on height and weight. Your health care provider can help determine your BMI and help you achieve or maintain a healthy weight.  Get regular exercise  Get regular exercise. This is one of the most important things you can do for your health. Most adults should:  Exercise for at least 150 minutes each week. The exercise should increase your heart rate and make you sweat (moderate-intensity exercise).  Do strengthening exercises at least twice a week. This is in addition to the moderate-intensity exercise.  Spend less time sitting. Even light physical activity can be beneficial.  Watch cholesterol and blood lipids  Have your blood tested for lipids and cholesterol at 39 years of age, then have this test every 5 years.  You may need to have your cholesterol levels checked more often if:  Your lipid or cholesterol levels are high.  You are older than 39 years of age.  You are at high risk for heart disease.  What should I know about cancer screening?  Many types of cancers can be detected early and may often be prevented. Depending on your health history and family history, you may need to have cancer screening at various ages. This may include screening for:  Colorectal cancer.  Prostate cancer.  Skin cancer.  Lung  cancer.  What should I know about heart disease, diabetes, and high blood pressure?  Blood pressure and heart disease  High blood pressure causes heart disease and increases the risk of stroke. This is more likely to develop in people who have high blood pressure readings or are overweight.  Talk with your health care provider about your target blood pressure readings.  Have your blood pressure checked:  Every 3-5 years if you are 39-39 years of age.  Every year if you are 3 years old or older.  If you are between the ages of 60 and 72 and are a current or former smoker, ask your health care provider if you should have a one-time screening for abdominal aortic aneurysm (AAA).  Diabetes  Have regular diabetes screenings. This checks your fasting blood sugar level. Have the screening done:  Once every three years after age 39 if you are at a normal weight and have a low risk for diabetes.  More often and at a younger age if you are overweight or have a high risk for diabetes.  What should I know about preventing infection?  Hepatitis B  If you have a higher risk for hepatitis B, you should be screened for this virus. Talk with your health care provider to find out if you are at risk for hepatitis B infection.  Hepatitis C  Blood testing is recommended for:  Everyone born from [birth year] through 1966.  Anyone  with known risk factors for hepatitis C.  Sexually transmitted infections (STIs)  You should be screened each year for STIs, including gonorrhea and chlamydia, if:  You are sexually active and are younger than 39 years of age.  You are older than 39 years of age and your health care provider tells you that you are at risk for this type of infection.  Your sexual activity has changed since you were last screened, and you are at increased risk for chlamydia or gonorrhea. Ask your health care provider if you are at risk.  Ask your health care provider about whether you are at high risk for HIV. Your health care provider  may recommend a prescription medicine to help prevent HIV infection. If you choose to take medicine to prevent HIV, you should first get tested for HIV. You should then be tested every 3 months for as long as you are taking the medicine.  Follow these instructions at home:  Alcohol use  Do not drink alcohol if your health care provider tells you not to drink.  If you drink alcohol:  Limit how much you have to 0-2 drinks a day.  Know how much alcohol is in your drink. In the [place] one drink equals one 12 oz bottle of beer (355 mL), one 5 oz glass of wine (148 mL), or one 1 oz glass of hard liquor (44 mL).  Lifestyle  Do not use any products that contain nicotine or tobacco. These products include cigarettes, chewing tobacco, and vaping devices, such as e-cigarettes. If you need help quitting, ask your health care provider.  Do not use street drugs.  Do not share needles.  Ask your health care provider for help if you need support or information about quitting drugs.  General instructions  Schedule regular health, dental, and eye exams.  Stay current with your vaccines.  Tell your health care provider if:  You often feel depressed.  You have ever been abused or do not feel safe at home.  Summary  Adopting a healthy lifestyle and getting preventive care are important in promoting health and wellness.  Follow your health care provider's instructions about healthy diet, exercising, and getting tested or screened for diseases.  Follow your health care provider's instructions on monitoring your cholesterol and blood pressure.  This information is not intended to replace advice given to you by your health care provider. Make sure you discuss any questions you have with your health care provider.  Document Revised: 05/13/2020 Document Reviewed: 05/13/2020  Elsevier Patient Education  2024 [employer].

## 2023-12-10 LAB — CBC
Hematocrit: 47.9 % (ref 37.5–51.0)
Hemoglobin: 16.4 g/dL (ref 13.0–17.7)
MCH: 31.7 pg (ref 26.6–33.0)
MCHC: 34.2 g/dL (ref 31.5–35.7)
MCV: 93 fL (ref 79–97)
Platelets: 266 x10E3/uL (ref 150–450)
RBC: 5.17 x10E6/uL (ref 4.14–5.80)
RDW: 12 % (ref 11.6–15.4)
WBC: 4.8 x10E3/uL (ref 3.4–10.8)

## 2023-12-10 LAB — CMP14+EGFR
ALT: 23 IU/L (ref 0–44)
AST: 25 IU/L (ref 0–40)
Albumin: 4.8 g/dL (ref 4.1–5.1)
Alkaline Phosphatase: 77 IU/L (ref 47–123)
BUN/Creatinine Ratio: 16 (ref 9–20)
BUN: 18 mg/dL (ref 6–20)
Bilirubin Total: 0.3 mg/dL (ref 0.0–1.2)
CO2: 19 mmol/L — ABNORMAL LOW (ref 20–29)
Calcium: 10 mg/dL (ref 8.7–10.2)
Chloride: 103 mmol/L (ref 96–106)
Creatinine, Ser: 1.11 mg/dL (ref 0.76–1.27)
Globulin, Total: 2.2 g/dL (ref 1.5–4.5)
Glucose: 80 mg/dL (ref 70–99)
Potassium: 4.1 mmol/L (ref 3.5–5.2)
Sodium: 142 mmol/L (ref 134–144)
Total Protein: 7 g/dL (ref 6.0–8.5)
eGFR: 87 mL/min/1.73 (ref >59)

## 2023-12-10 LAB — LIPID PANEL
Chol/HDL Ratio: 3.7 ratio (ref 0.0–5.0)
Cholesterol, Total: 205 mg/dL — ABNORMAL HIGH (ref 100–199)
HDL: 55 mg/dL (ref >39)
LDL Chol Calc (NIH): 135 mg/dL — ABNORMAL HIGH (ref 0–99)
Triglycerides: 84 mg/dL (ref 0–149)
VLDL Cholesterol Cal: 15 mg/dL (ref 5–40)

## 2023-12-10 LAB — HIV ANTIBODY (ROUTINE TESTING W REFLEX): HIV Screen 4th Generation wRfx: NONREACTIVE

## 2023-12-10 LAB — HCV RNA QUANT: Hepatitis C Quantitation: NOT DETECTED [IU]/mL

## 2023-12-10 LAB — HEPATITIS B SURFACE ANTIBODY,QUALITATIVE: Hep B Surface Ab, Qual: NONREACTIVE

## 2023-12-17 DIAGNOSIS — Z419 Encounter for procedure for purposes other than remedying health state, unspecified: Secondary | ICD-10-CM | POA: Diagnosis not present

## 2023-12-20 ENCOUNTER — Ambulatory Visit: Payer: Self-pay | Admitting: Family Medicine

## 2023-12-20 DIAGNOSIS — Z136 Encounter for screening for cardiovascular disorders: Secondary | ICD-10-CM | POA: Insufficient documentation

## 2023-12-20 DIAGNOSIS — Z1159 Encounter for screening for other viral diseases: Secondary | ICD-10-CM | POA: Insufficient documentation

## 2023-12-20 DIAGNOSIS — Z0000 Encounter for general adult medical examination without abnormal findings: Secondary | ICD-10-CM | POA: Insufficient documentation

## 2023-12-20 DIAGNOSIS — Z114 Encounter for screening for human immunodeficiency virus [HIV]: Secondary | ICD-10-CM | POA: Insufficient documentation

## 2023-12-20 NOTE — Progress Notes (Signed)
 Hepatitis C not detected. Kidney function and liver function are normal. Your cholesterol levels are elevated, so low fat diet and exercise is advised.   Thanks! Hepatitis C not detected. Kidney function and liver function are normal. Your cholesterol levels are elevated, so low fat diet and exercise is advised.   Thanks!

## 2024-05-22 ENCOUNTER — Ambulatory Visit: Admitting: Physician Assistant

## 2024-12-13 ENCOUNTER — Encounter: Admitting: Family Medicine
